# Patient Record
Sex: Female | Born: 1980 | Race: Black or African American | Hispanic: No | Marital: Single | Smoking: Never smoker
Health system: Southern US, Community
[De-identification: ages and names within clinical notes are randomized; demographics above are authoritative.]

## PROBLEM LIST (undated history)

## (undated) ENCOUNTER — Inpatient Hospital Stay: Payer: Self-pay

## (undated) DIAGNOSIS — B20 Human immunodeficiency virus [HIV] disease: Secondary | ICD-10-CM

## (undated) DIAGNOSIS — Z21 Asymptomatic human immunodeficiency virus [HIV] infection status: Secondary | ICD-10-CM

## (undated) DIAGNOSIS — D649 Anemia, unspecified: Secondary | ICD-10-CM

## (undated) DIAGNOSIS — E079 Disorder of thyroid, unspecified: Secondary | ICD-10-CM

## (undated) DIAGNOSIS — J45909 Unspecified asthma, uncomplicated: Secondary | ICD-10-CM

---

## 2016-04-11 ENCOUNTER — Encounter: Payer: Self-pay | Admitting: *Deleted

## 2016-04-11 ENCOUNTER — Emergency Department
Admission: EM | Admit: 2016-04-11 | Discharge: 2016-04-11 | Disposition: A | Discharge status: Home / Self Care | Payer: Medicaid Other | Attending: Emergency Medicine | Admitting: Emergency Medicine

## 2016-04-11 DIAGNOSIS — R200 Anesthesia of skin: Secondary | ICD-10-CM | POA: Diagnosis present

## 2016-04-11 DIAGNOSIS — G510 Bell's palsy: Secondary | ICD-10-CM | POA: Diagnosis not present

## 2016-04-11 HISTORY — DX: Disorder of thyroid, unspecified: E07.9

## 2016-04-11 MED ORDER — VALACYCLOVIR HCL 1 G PO TABS: 2000.0000 mg | ORAL_TABLET | Freq: Three times a day (TID) | ORAL | Status: AC

## 2016-04-11 MED ORDER — PREDNISONE 20 MG PO TABS: 60.0000 mg | ORAL_TABLET | Freq: Every day | ORAL | Status: DC

## 2016-04-11 NOTE — ED Provider Notes (Signed)
[facility] Emergency Department Provider Note        Time seen: ----------------------------------------- 10:00 AM on 04/11/2016 -----------------------------------------    I have reviewed the triage vital signs and the nursing notes.   HISTORY  Chief Complaint Numbness    HPI Taylor Oneal is a 35 y.o. female who presents to ER for complaints of left-sided facial weakness for the last week. She denies any other symptoms. Patient states it's time for her to close her left eye,she has never had these problems before. She is [redacted] weeks pregnant, denies vaginal bleeding or leakage of fluid. Baby is moving normally   Past Medical History  Diagnosis Date  . Thyroid disease     There are no active problems to display for this patient.   History reviewed. No pertinent past surgical history.  Allergies Review of patient's allergies indicates no known allergies.  Social History Social History  Substance Use Topics  . Smoking status: Never Smoker   . Smokeless tobacco: None  . Alcohol Use: No    Review of Systems Constitutional: Negative for fever. Eyes: Negative for visual changes. ENT: Negative for sore throat. Cardiovascular: Negative for chest pain. Respiratory: Negative for shortness of breath. Gastrointestinal: Negative for abdominal pain, vomiting and diarrhea. Skin: Negative for rash. Neurological: Negative for headaches, Positive for left-sided facial weakness  10-point ROS otherwise negative.  ____________________________________________   PHYSICAL EXAM:  VITAL SIGNS: ED Triage Vitals  Enc Vitals Group     BP 04/11/16 0954 118/78 mmHg     Pulse --      Resp 04/11/16 0954 17     Temp 04/11/16 0954 98.6 F (37 C)     Temp src --      SpO2 --      Weight --      Height --      Head Cir --      Peak Flow --      Pain Score --      Pain Loc --      Pain Edu? --      Excl. in [place]? --     Constitutional: Alert and  oriented. Well appearing and in no distress. Eyes: Conjunctivae are normal. PERRL. Normal extraocular movements.No conjunctival injection ENT   Head: Normocephalic and atraumatic.   Nose: No congestion/rhinnorhea.   Mouth/Throat: Mucous membranes are moist.   Neck: No stridor. Cardiovascular: Normal rate, regular rhythm. No murmurs, rubs, or gallops. Respiratory: Normal respiratory effort without tachypnea nor retractions. Breath sounds are clear and equal bilaterally. No wheezes/rales/rhonchi. Gastrointestinal: Soft and nontender. Normal bowel sounds. Gravid uterus, nontender Musculoskeletal: Nontender with normal range of motion in all extremities. No lower extremity tenderness nor edema. Neurologic:  Normal speech and language. 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 No other focal neurologic deficits are appreciated Skin:  Skin is warm, dry and intact. No rash noted. Psychiatric: Mood and affect are normal. Speech and behavior are normal.  ____________________________________________  ED COURSE:  Pertinent labs & imaging results that were available during my care of the patient were reviewed by me and considered in my medical decision making (see chart for details). Patient presents to ER with clinical Bell's palsy. We'll begin treatment for same.  ____________________________________________  FINAL ASSESSMENT AND PLAN  Bell's palsy  Plan: Patient with clinical Bell's palsy in pregnancy. She'll be started on prednisone and valacyclovir. She is encouraged to have close follow-up with her doctor for reevaluation.   Earleen Newport, MD  Note: This dictation was prepared with Dragon dictation. Any transcriptional errors that result from this process are unintentional   Earleen Newport, MD 04/11/16 1018

## 2016-04-11 NOTE — ED Notes (Signed)
Fetal Heart Tone assess at rate of 108

## 2016-04-11 NOTE — ED Notes (Signed)
Pt is [redacted] weeks pregnant, pt complains of left sided facial numbness for 1 week, pt denies any other symptoms

## 2016-04-11 NOTE — Discharge Instructions (Signed)
Bell Palsy °Bell palsy is a condition in which the muscles on one side of the face become paralyzed. This often causes one side of the face to droop. It is a common condition and most people recover completely. °RISK FACTORS °Risk factors for Bell palsy include: °· Pregnancy. °· Diabetes. °· An infection by a virus, such as infections that cause cold sores. °CAUSES  °Bell palsy is caused by damage to or inflammation of a nerve in your face. It is unclear why this happens, but an infection by a virus may lead to it. Most of the time the reason it happens is unknown. °SIGNS AND SYMPTOMS  °Symptoms can range from mild to severe and can take place over a number of hours. Symptoms may include: °· Being unable to: °¨ Raise one or both eyebrows. °¨ Close one or both eyes. °¨ Feel parts of your face (facial numbness). °· Drooping of the eyelid and corner of the mouth. °· Weakness in the face. °· Paralysis of half your face. °· Loss of taste. °· Sensitivity to loud noises. °· Difficulty chewing. °· Tearing up of the affected eye. °· Dryness in the affected eye. °· Drooling. °· Pain behind one ear. °DIAGNOSIS  °Diagnosis of Bell palsy may include: °· A medical history and physical exam. °· An MRI. °· A CT scan. °· Electromyography (EMG). This is a test that checks how your nerves are working. °TREATMENT  °Treatment may include antiviral medicine to help shorten the length of the condition. Sometimes treatment is not needed and the symptoms go away on their own. °HOME CARE INSTRUCTIONS  °· Take medicines only as directed by your health care provider. °· Do facial massages and exercises as directed by your health care provider. °· If your eye is affected: °¨ Use moisturizing eye drops to prevent drying of your eye as directed by your health care provider. °¨ Protect your eye as directed by your health care provider. °SEEK MEDICAL CARE IF: °· Your symptoms do not get better or get worse. °· You are drooling. °· Your eye is red,  irritated, or hurts. °SEEK IMMEDIATE MEDICAL CARE IF:  °· Another part of your body feels weak or numb. °· You have difficulty swallowing. °· You have a fever along with symptoms of Bell palsy. °· You develop neck pain. °MAKE SURE YOU:  °· Understand these instructions. °· Will watch your condition. °· Will get help right away if you are not doing well or get worse. °  °This information is not intended to replace advice given to you by your health care provider. Make sure you discuss any questions you have with your health care provider. °  °Document Released: 09/17/2005 Document Revised: 06/08/2015 Document Reviewed: 12/25/2013 °Elsevier Interactive Patient Education ©2016 [employer] ° °

## 2016-04-11 NOTE — ED Notes (Signed)
Pt verbalized understanding of discharge instructions. NAD at this time. 

## 2016-06-13 ENCOUNTER — Other Ambulatory Visit: Payer: Self-pay | Admitting: Obstetrics and Gynecology

## 2016-06-13 DIAGNOSIS — D259 Leiomyoma of uterus, unspecified: Secondary | ICD-10-CM

## 2016-06-13 DIAGNOSIS — O3413 Maternal care for benign tumor of corpus uteri, third trimester: Principal | ICD-10-CM

## 2016-06-15 ENCOUNTER — Ambulatory Visit
Admission: RE | Admit: 2016-06-15 | Discharge: 2016-06-15 | Disposition: A | Discharge status: Home / Self Care | Payer: Medicaid Other | Source: Ambulatory Visit | Attending: Obstetrics and Gynecology | Admitting: Obstetrics and Gynecology

## 2016-06-15 DIAGNOSIS — Z3A39 39 weeks gestation of pregnancy: Secondary | ICD-10-CM | POA: Diagnosis not present

## 2016-06-15 DIAGNOSIS — D259 Leiomyoma of uterus, unspecified: Secondary | ICD-10-CM

## 2016-06-15 DIAGNOSIS — O3413 Maternal care for benign tumor of corpus uteri, third trimester: Secondary | ICD-10-CM | POA: Insufficient documentation

## 2016-06-20 ENCOUNTER — Inpatient Hospital Stay
Admission: EM | Admit: 2016-06-20 | Discharge: 2016-06-20 | Disposition: A | Discharge status: Home / Self Care | Payer: Medicaid Other | Attending: Obstetrics and Gynecology | Admitting: Obstetrics and Gynecology

## 2016-06-20 DIAGNOSIS — Z3A40 40 weeks gestation of pregnancy: Secondary | ICD-10-CM | POA: Diagnosis not present

## 2016-06-20 HISTORY — DX: Asymptomatic human immunodeficiency virus (hiv) infection status: Z21

## 2016-06-20 HISTORY — DX: Unspecified asthma, uncomplicated: J45.909

## 2016-06-20 HISTORY — DX: Human immunodeficiency virus (HIV) disease: B20

## 2016-06-20 MED ORDER — OXYCODONE-ACETAMINOPHEN 5-325 MG PO TABS
1.0000 | ORAL_TABLET | Freq: Once | ORAL | Status: AC
  Administered 2016-06-20: 1 via ORAL
  Filled 2016-06-20: qty 1

## 2016-06-20 NOTE — Progress Notes (Signed)
Patient ID: Taylor Oneal, female   DOB: [birth year], 35 y.o.   MRN: [id number] [name] 05/17/1981 G1 P0 [redacted]w[redacted]d presents for ctx  Possible LOF , + vaginal bleeding , O;Temp 98.8 F (37.1 C) (Oral)   Resp 16  ABDsoft  CX 1/80/-1 vtx NSTreactive , CTX q 3-5 min Labs: neg fern  A: no rom , Ctx no active labor , P:d/c home  Po percocet  Precautions given

## 2016-06-20 NOTE — OB Triage Note (Signed)
Patient came in today c/o contractions that began yesterday afternoon. Ctrx are more intense and closer together per patient. Patient c/o vaginal bleeding that began today at 4am. Pt reports filling a pad with blood and passed clots. Reports positive fetal movement.

## 2016-06-20 NOTE — Discharge Summary (Signed)
  Patient ID: Taylor Oneal, female   DOB: 1980/10/06, 35 y.o.   MRN: [id number] [name] 06/21/1981 G1 P0 [redacted]w[redacted]d presents for ctx  Possible LOF , + vaginal bleeding , O;Temp 98.8 F (37.1 C) (Oral)   Resp 16  ABDsoft  CX 1/80/-1 vtx NSTreactive , CTX q 3-5 min Labs: neg fern  A: no rom , Ctx no active labor , P:d/c home  Po percocet  Precautions given     Electronically signed by Boykin Nearing, MD at 06/20/2016 8:42 AM

## 2016-06-22 ENCOUNTER — Encounter: Payer: Self-pay | Admitting: *Deleted

## 2016-06-22 ENCOUNTER — Inpatient Hospital Stay
Admission: EM | Admit: 2016-06-22 | Discharge: 2016-06-25 | DRG: 765 | Disposition: A | Discharge status: Home / Self Care | Payer: Medicaid Other | Attending: Obstetrics and Gynecology | Admitting: Obstetrics and Gynecology

## 2016-06-22 DIAGNOSIS — O98719 Human immunodeficiency virus [HIV] disease complicating pregnancy, unspecified trimester: Secondary | ICD-10-CM

## 2016-06-22 DIAGNOSIS — O9872 Human immunodeficiency virus [HIV] disease complicating childbirth: Secondary | ICD-10-CM | POA: Diagnosis present

## 2016-06-22 DIAGNOSIS — D62 Acute posthemorrhagic anemia: Secondary | ICD-10-CM | POA: Diagnosis not present

## 2016-06-22 DIAGNOSIS — Z79899 Other long term (current) drug therapy: Secondary | ICD-10-CM | POA: Diagnosis not present

## 2016-06-22 DIAGNOSIS — O9902 Anemia complicating childbirth: Secondary | ICD-10-CM | POA: Diagnosis present

## 2016-06-22 DIAGNOSIS — Z21 Asymptomatic human immunodeficiency virus [HIV] infection status: Secondary | ICD-10-CM | POA: Diagnosis present

## 2016-06-22 DIAGNOSIS — Z3A40 40 weeks gestation of pregnancy: Secondary | ICD-10-CM

## 2016-06-22 DIAGNOSIS — D259 Leiomyoma of uterus, unspecified: Secondary | ICD-10-CM | POA: Diagnosis present

## 2016-06-22 DIAGNOSIS — O9952 Diseases of the respiratory system complicating childbirth: Secondary | ICD-10-CM | POA: Diagnosis present

## 2016-06-22 DIAGNOSIS — O99820 Streptococcus B carrier state complicating pregnancy: Secondary | ICD-10-CM

## 2016-06-22 DIAGNOSIS — E039 Hypothyroidism, unspecified: Secondary | ICD-10-CM | POA: Diagnosis present

## 2016-06-22 DIAGNOSIS — J45909 Unspecified asthma, uncomplicated: Secondary | ICD-10-CM | POA: Diagnosis present

## 2016-06-22 DIAGNOSIS — O0993 Supervision of high risk pregnancy, unspecified, third trimester: Secondary | ICD-10-CM

## 2016-06-22 DIAGNOSIS — O3413 Maternal care for benign tumor of corpus uteri, third trimester: Secondary | ICD-10-CM | POA: Diagnosis present

## 2016-06-22 DIAGNOSIS — O99519 Diseases of the respiratory system complicating pregnancy, unspecified trimester: Secondary | ICD-10-CM

## 2016-06-22 DIAGNOSIS — O99344 Other mental disorders complicating childbirth: Secondary | ICD-10-CM | POA: Diagnosis present

## 2016-06-22 DIAGNOSIS — O99824 Streptococcus B carrier state complicating childbirth: Secondary | ICD-10-CM | POA: Diagnosis present

## 2016-06-22 DIAGNOSIS — F329 Major depressive disorder, single episode, unspecified: Secondary | ICD-10-CM | POA: Diagnosis present

## 2016-06-22 DIAGNOSIS — O99019 Anemia complicating pregnancy, unspecified trimester: Secondary | ICD-10-CM

## 2016-06-22 DIAGNOSIS — O99284 Endocrine, nutritional and metabolic diseases complicating childbirth: Secondary | ICD-10-CM | POA: Diagnosis present

## 2016-06-22 LAB — TYPE AND SCREEN
ABO/RH(D): O POS
Antibody Screen: NEGATIVE

## 2016-06-22 LAB — CBC WITH DIFFERENTIAL/PLATELET
Basophils Absolute: 0 10*3/uL (ref 0–0.1)
Basophils Relative: 0 %
EOS ABS: 0.1 10*3/uL (ref 0–0.7)
EOS PCT: 4 %
HCT: 32.6 % — ABNORMAL LOW (ref 35.0–47.0)
Hemoglobin: 11.6 g/dL — ABNORMAL LOW (ref 12.0–16.0)
LYMPHS ABS: 0.7 10*3/uL — AB (ref 1.0–3.6)
Lymphocytes Relative: 18 %
MCH: 33.1 pg (ref 26.0–34.0)
MCHC: 35.6 g/dL (ref 32.0–36.0)
MCV: 92.9 fL (ref 80.0–100.0)
MONO ABS: 0.6 10*3/uL (ref 0.2–0.9)
MONOS PCT: 15 %
Neutro Abs: 2.6 10*3/uL (ref 1.4–6.5)
Neutrophils Relative %: 63 %
PLATELETS: 153 10*3/uL (ref 150–440)
RBC: 3.51 MIL/uL — ABNORMAL LOW (ref 3.80–5.20)
RDW: 13 % (ref 11.5–14.5)
WBC: 4.1 10*3/uL (ref 3.6–11.0)

## 2016-06-22 MED ORDER — LIDOCAINE HCL (PF) 1 % IJ SOLN
INTRAMUSCULAR | Status: AC
  Filled 2016-06-22: qty 30

## 2016-06-22 MED ORDER — PENICILLIN G POTASSIUM 5000000 UNITS IJ SOLR
5.0000 10*6.[IU] | Freq: Once | INTRAVENOUS | Status: AC
  Administered 2016-06-22: 5 10*6.[IU] via INTRAVENOUS
  Filled 2016-06-22 (×2): qty 5

## 2016-06-22 MED ORDER — TENOFOVIR DISOPROXIL FUMARATE 300 MG PO TABS: 300.0000 mg | ORAL_TABLET | Freq: Every day | ORAL | Status: DC

## 2016-06-22 MED ORDER — PENICILLIN G POTASSIUM 5000000 UNITS IJ SOLR
2.5000 10*6.[IU] | INTRAVENOUS | Status: DC
  Administered 2016-06-22 – 2016-06-23 (×2): 2.5 10*6.[IU] via INTRAVENOUS
  Filled 2016-06-22 (×9): qty 2.5

## 2016-06-22 MED ORDER — LIDOCAINE HCL (PF) 1 % IJ SOLN: 30.0000 mL | INTRAMUSCULAR | Status: DC | PRN

## 2016-06-22 MED ORDER — AMPICILLIN SODIUM 1 G IJ SOLR
INTRAMUSCULAR | Status: AC
  Administered 2016-06-22
  Filled 2016-06-22: qty 1000

## 2016-06-22 MED ORDER — AMMONIA AROMATIC IN INHA
RESPIRATORY_TRACT | Status: AC
  Filled 2016-06-22: qty 10

## 2016-06-22 MED ORDER — MISOPROSTOL 200 MCG PO TABS
ORAL_TABLET | ORAL | Status: AC
  Filled 2016-06-22: qty 4

## 2016-06-22 MED ORDER — SODIUM CHLORIDE FLUSH 0.9 % IV SOLN
INTRAVENOUS | Status: AC
  Filled 2016-06-22: qty 20

## 2016-06-22 MED ORDER — ACETAMINOPHEN 325 MG PO TABS: 650.0000 mg | ORAL_TABLET | ORAL | Status: DC | PRN

## 2016-06-22 MED ORDER — OXYTOCIN 10 UNIT/ML IJ SOLN
INTRAMUSCULAR | Status: AC
  Filled 2016-06-22: qty 2

## 2016-06-22 MED ORDER — FENTANYL CITRATE (PF) 100 MCG/2ML IJ SOLN
50.0000 ug | INTRAMUSCULAR | Status: DC | PRN
  Administered 2016-06-22 (×2): 100 ug via INTRAVENOUS
  Filled 2016-06-22 (×2): qty 2

## 2016-06-22 MED ORDER — BUTORPHANOL TARTRATE 1 MG/ML IJ SOLN
1.0000 mg | INTRAMUSCULAR | Status: DC | PRN
  Administered 2016-06-22: 1 mg via INTRAVENOUS
  Filled 2016-06-22: qty 1

## 2016-06-22 MED ORDER — OXYTOCIN 40 UNITS IN LACTATED RINGERS INFUSION - SIMPLE MED: 2.5000 [IU]/h | INTRAVENOUS | Status: DC

## 2016-06-22 MED ORDER — SOD CITRATE-CITRIC ACID 500-334 MG/5ML PO SOLN
30.0000 mL | ORAL | Status: DC | PRN
  Administered 2016-06-23: 30 mL via ORAL

## 2016-06-22 MED ORDER — OXYTOCIN BOLUS FROM INFUSION: 500.0000 mL | Freq: Once | INTRAVENOUS | Status: DC

## 2016-06-22 MED ORDER — LACTATED RINGERS IV SOLN: 500.0000 mL | INTRAVENOUS | Status: DC | PRN

## 2016-06-22 MED ORDER — ONDANSETRON HCL 4 MG/2ML IJ SOLN
4.0000 mg | Freq: Four times a day (QID) | INTRAMUSCULAR | Status: DC | PRN
  Administered 2016-06-23: 4 mg via INTRAVENOUS

## 2016-06-22 MED ORDER — ZIDOVUDINE 10 MG/ML IV SOLN
2.0000 mg/kg | Freq: Once | INTRAVENOUS | Status: AC
  Administered 2016-06-23: 176 mg via INTRAVENOUS
  Filled 2016-06-22: qty 17.6

## 2016-06-22 MED ORDER — ZIDOVUDINE NICU ORAL SYRINGE 10 MG/ML: 4.0000 mg/kg | ORAL_SOLUTION | Freq: Two times a day (BID) | ORAL | Status: DC

## 2016-06-22 MED ORDER — EMTRICITABINE-TENOFOVIR DF 200-300 MG PO TABS
1.0000 | ORAL_TABLET | Freq: Every day | ORAL | Status: DC
  Administered 2016-06-23 – 2016-06-25 (×3): 1 via ORAL
  Filled 2016-06-22 (×4): qty 1

## 2016-06-22 MED ORDER — LACTATED RINGERS IV SOLN
INTRAVENOUS | Status: DC
  Administered 2016-06-22 – 2016-06-23 (×2): via INTRAVENOUS

## 2016-06-22 MED ORDER — RALTEGRAVIR POTASSIUM 400 MG PO TABS
400.0000 mg | ORAL_TABLET | Freq: Two times a day (BID) | ORAL | Status: DC
  Administered 2016-06-22 – 2016-06-25 (×6): 400 mg via ORAL
  Filled 2016-06-22 (×8): qty 1

## 2016-06-22 MED ORDER — EMTRICITABINE 200 MG PO CAPS: 200.0000 mg | ORAL_CAPSULE | Freq: Every day | ORAL | Status: DC

## 2016-06-22 MED ORDER — FLEET ENEMA 7-19 GM/118ML RE ENEM: 1.0000 | ENEMA | RECTAL | Status: DC | PRN

## 2016-06-22 MED ORDER — ZIDOVUDINE 10 MG/ML IV SOLN
1.0000 mg/kg/h | INTRAVENOUS | Status: DC
  Administered 2016-06-23: 1 mg/kg/h via INTRAVENOUS
  Filled 2016-06-22: qty 40

## 2016-06-22 MED ORDER — TERBUTALINE SULFATE 1 MG/ML IJ SOLN: 0.2500 mg | Freq: Once | INTRAMUSCULAR | Status: DC | PRN

## 2016-06-22 MED ORDER — OXYTOCIN 40 UNITS IN LACTATED RINGERS INFUSION - SIMPLE MED
1.0000 m[IU]/min | INTRAVENOUS | Status: DC
  Administered 2016-06-22: 1 m[IU]/min via INTRAVENOUS

## 2016-06-22 NOTE — OB Triage Note (Signed)
Presents with complaint of SROM at 1210 this afternoon. States she soaked thru pad and pants. States fluid was clear. Having contractions as well.

## 2016-06-22 NOTE — Progress Notes (Signed)
S:  Breathing well through ctxs      Still having variable decelerations with every other contraction to nadir of 60 bpm with good return to baseline, moderate variability and +accels       Has had second dose of Fentanyl   O:  VS: Blood pressure 113/63, pulse 71, temperature 97.9 F (36.6 C), temperature source Oral, resp. rate 18, height 5\' 4"  (1.626 m), weight 88 kg (194 lb).        FHR : baseline 120 bpm / variability moderate / accelerations + / variable decelerations        Toco: contractions every 4-6 minutes / mild        Cervix : Dilation: 1 Effacement (%): 90 Cervical Position: Anterior Station: -1 Presentation: Vertex Exam by:: Clementeen Graham, RN        Membranes: SROM - clear fluid  A: Latent labor     FHR category 2     HIV positive  P: Discussed with Dr. Leonides Schanz - okay to try to start Pitocin        AZT loading dose 176mg  ordered and instructed to give first, then AZT continuous dose      Will continue to monitor closely   Lars Pinks, CNM

## 2016-06-22 NOTE — Progress Notes (Signed)
S:  Pt. States she did not like the way the Stadol feels, and is still having painful ctxs.  She is requesting something else for pain.   O:  VS: Blood pressure 113/63, pulse 71, temperature 97.9 F (36.6 C), temperature source Oral, resp. rate 18, height 5\' 4"  (1.626 m), weight 88 kg (194 lb).        FHR : baseline 115 bpm / variability moderate / accelerations + / variable decelerations to a nadir of 60bpm with every other ctx        Toco: contractions every 3-8 minutes / moderate         Cervix : Dilation: 1 Effacement (%): 80 Cervical Position: Anterior Station: -2, -1 Presentation: Vertex Exam by:: JMG        Membranes: SROM - clear fluid  A: Latent labor     FHR category 2  P: Dr. Leonides Schanz notified and aware of variables - we will try to avoid using IUPC if we can     HIV RNA pending     Fentanyl 50-156mcg every 1 hour PRN      Will begin Pitocin if she is unable to make cervical change     Reassess in 1-2 hours  Taylor Oneal,CNM

## 2016-06-22 NOTE — H&P (Signed)
OB ADMISSION/ HISTORY & PHYSICAL:  Admission Date: 06/22/2016  1:17 PM  Admit Diagnosis: SROM at 40+4 weeks   Taylor Oneal is a 35 y.o. female G1P0 at 40+4 weeks presenting for SROM at 1210 and reports clear fluid.  Prenatal History: G1P0   EDC : 06/18/2016,  [facility]atal care at [facility] and [facility] - late transfer to [facility] for delivery for privacy purposes Prenatal course complicated by HIV Positive on Isentress and Truvada with last viral load undetectable, GBS positive, multiple uterine fibroids, anemia, depression, asthma   Prenatal Labs: ABO, Rh: --/--/O POS (09/22 1400) Antibody: NEG (09/22 1400) Rubella:   Immune Varicella: Immune RPR:   NR HBsAg:   Negative  HIV:   Positive  GTT: 110 GBS:   Positive   Medical / Surgical History :  Past medical history:  Past Medical History:  Diagnosis Date  . Asthma    long time ago per patient   . HIV (human immunodeficiency virus infection) ([place])   . Thyroid disease      Past surgical history: History reviewed. No pertinent surgical history.  Family History:  Family History  Problem Relation Age of Onset  . Hypertension Mother   . Diabetes Father   . Hypertension Father   . Hypertension Maternal Grandfather      Social History:  reports that she has never smoked. She has never used smokeless tobacco. She reports that she does not drink alcohol or use drugs.   Allergies: Review of patient's allergies indicates no known allergies.    Current Medications at time of admission:  Prior to Admission medications   Medication Sig Start Date End Date Taking? Authorizing Provider  raltegravir (ISENTRESS) 400 MG tablet Take 400 mg by mouth 2 (two) times daily.   Yes Historical Provider, MD  predniSONE (DELTASONE) 20 MG tablet Take 3 tablets (60 mg total) by mouth daily with breakfast. Patient not taking: Reported on 06/20/2016 04/11/16   Earleen Newport, MD  Prenatal Vit-Fe Fumarate-FA (PRENATAL MULTIVITAMIN) TABS tablet  Take 1 tablet by mouth daily at 12 noon.    Historical Provider, MD     Review of Systems: Active FM Irregular ctxs SROM at 1210 - clear fluid bloody show present   Physical Exam:  VS: Blood pressure 105/78, pulse 80, temperature 97.9 F (36.6 C), temperature source Oral, resp. rate 18, height 5\' 4"  (1.626 m), weight 88 kg (194 lb).  General: alert and oriented, appears calm Heart: RRR Lungs: Clear lung fields Abdomen: Gravid, soft and non-tender, non-distended / uterus: non-tender Extremities: no edema  Genitalia / VE: Dilation: 1 Effacement (%): 80 Station: -2, -1 Exam by:: JMG  FHR: baseline rate 125 bpm / variability moderate / accelerations + / no decelerations TOCO: every 4-8minutes  Assessment: 40+[redacted] weeks gestation Latent stage of labor FHR category 2 HIV Positive    Plan:  1. Admit to Birth Place    - Routine labor and delivery orders    - Stadol 1mg  every 1 hour PRN for pain     - May have epidural upon request  2. GBS Positive    - PCN 5 million units loading dose, then 2.5 million units every 4 hours 3. HIV Positive    - Continue Truvada and Isentress daily     - HIV RNA viral load ordered    - Discussed starting IV AZT, and at this time is not necessary since her last viral load was not detected, but we will wait to see this  viral load      - Avoid internal monitors    PLAN FOR BABY: we usually give IV zidovudine intrapartum (although is not absolutely necessary if viral load is suppressed but we still do it at [facility] for consistency). 2. the baby will need to be treated with PO Zidovudine 4mg /kg/dose every 12 hours, given as soon as possible after birth (within 6 hours please!). can give IV if baby is NPO. 3. If there are delivery complications, such as maternal chorioamnionitis, prolonged ROM, please let us know as we may need to treat the baby with additional anti-retroviral medications. 4. The baby's bath should be expedited after delivery; please  defer vitamin K injection and blood draws until AFTER bath has been performed.  5. Send a HIV test at birth for the baby--we use [facility] for this - (test name: Human Immunodeficiency Virus 1 (HIV-1), Qualitative, RNA, performed at [place], TEST# [id number], need 40ml of blood in EDTA tube), Fax results to [place] [phone]  4. Anticipate NSVD  Dr. Leonides Schanz notified of admission / plan of care  Taylor Oneal, CNM

## 2016-06-23 ENCOUNTER — Inpatient Hospital Stay: Payer: Medicaid Other | Admitting: Anesthesiology

## 2016-06-23 ENCOUNTER — Encounter: Payer: Self-pay | Admitting: Anesthesiology

## 2016-06-23 ENCOUNTER — Encounter
Admission: EM | Disposition: A | Discharge status: Home / Self Care | Payer: Self-pay | Source: Home / Self Care | Attending: Obstetrics and Gynecology

## 2016-06-23 DIAGNOSIS — O98719 Human immunodeficiency virus [HIV] disease complicating pregnancy, unspecified trimester: Secondary | ICD-10-CM

## 2016-06-23 DIAGNOSIS — O99019 Anemia complicating pregnancy, unspecified trimester: Secondary | ICD-10-CM

## 2016-06-23 DIAGNOSIS — O0993 Supervision of high risk pregnancy, unspecified, third trimester: Secondary | ICD-10-CM

## 2016-06-23 DIAGNOSIS — O99519 Diseases of the respiratory system complicating pregnancy, unspecified trimester: Secondary | ICD-10-CM

## 2016-06-23 DIAGNOSIS — D259 Leiomyoma of uterus, unspecified: Secondary | ICD-10-CM

## 2016-06-23 DIAGNOSIS — J45909 Unspecified asthma, uncomplicated: Secondary | ICD-10-CM

## 2016-06-23 DIAGNOSIS — O99820 Streptococcus B carrier state complicating pregnancy: Secondary | ICD-10-CM

## 2016-06-23 LAB — RPR: RPR: NONREACTIVE

## 2016-06-23 SURGERY — Surgical Case
Anesthesia: Epidural

## 2016-06-23 MED ORDER — SOD CITRATE-CITRIC ACID 500-334 MG/5ML PO SOLN
ORAL | Status: AC
  Administered 2016-06-23: 30 mL via ORAL
  Filled 2016-06-23: qty 15

## 2016-06-23 MED ORDER — DIBUCAINE 1 % RE OINT
1.0000 | TOPICAL_OINTMENT | RECTAL | Status: DC | PRN
Start: 2016-06-23 — End: 2016-06-25

## 2016-06-23 MED ORDER — COCONUT OIL OIL: 1.0000 "application " | TOPICAL_OIL | Status: DC | PRN

## 2016-06-23 MED ORDER — CEFAZOLIN SODIUM-DEXTROSE 2-4 GM/100ML-% IV SOLN
INTRAVENOUS | Status: AC
  Administered 2016-06-23: 2 g via INTRAVENOUS
  Filled 2016-06-23: qty 100

## 2016-06-23 MED ORDER — DEXTROSE 5 % IV SOLN
500.0000 mg | Freq: Once | INTRAVENOUS | Status: AC
  Administered 2016-06-23: 500 mg via INTRAVENOUS
  Filled 2016-06-23: qty 500

## 2016-06-23 MED ORDER — DIPHENHYDRAMINE HCL 50 MG/ML IJ SOLN: 12.5000 mg | INTRAMUSCULAR | Status: DC | PRN

## 2016-06-23 MED ORDER — SODIUM CHLORIDE 0.9% FLUSH: 3.0000 mL | INTRAVENOUS | Status: DC | PRN

## 2016-06-23 MED ORDER — BUPIVACAINE LIPOSOME 1.3 % IJ SUSP
INTRAMUSCULAR | Status: DC | PRN
  Administered 2016-06-23: 50 mL

## 2016-06-23 MED ORDER — EPHEDRINE SULFATE 50 MG/ML IJ SOLN
INTRAMUSCULAR | Status: DC | PRN
Start: 2016-06-23 — End: 2016-06-23
  Administered 2016-06-23: 20 mg via INTRAVENOUS

## 2016-06-23 MED ORDER — OXYCODONE HCL 5 MG PO TABS
5.0000 mg | ORAL_TABLET | ORAL | Status: DC | PRN
  Administered 2016-06-24: 5 mg via ORAL

## 2016-06-23 MED ORDER — DEXAMETHASONE SODIUM PHOSPHATE 10 MG/ML IJ SOLN
INTRAMUSCULAR | Status: DC | PRN
  Administered 2016-06-23: 10 mg via INTRAVENOUS

## 2016-06-23 MED ORDER — PHENYLEPHRINE 40 MCG/ML (10ML) SYRINGE FOR IV PUSH (FOR BLOOD PRESSURE SUPPORT): 80.0000 ug | PREFILLED_SYRINGE | INTRAVENOUS | Status: DC | PRN

## 2016-06-23 MED ORDER — MEPERIDINE HCL 25 MG/ML IJ SOLN: 6.2500 mg | INTRAMUSCULAR | Status: DC | PRN

## 2016-06-23 MED ORDER — ACETAMINOPHEN 500 MG PO TABS
1000.0000 mg | ORAL_TABLET | Freq: Four times a day (QID) | ORAL | Status: DC
  Administered 2016-06-23 – 2016-06-25 (×6): 1000 mg via ORAL
  Filled 2016-06-23 (×7): qty 2

## 2016-06-23 MED ORDER — LACTATED RINGERS IV SOLN
INTRAVENOUS | Status: DC
Start: 2016-06-23 — End: 2016-06-23

## 2016-06-23 MED ORDER — WITCH HAZEL-GLYCERIN EX PADS: 1.0000 "application " | MEDICATED_PAD | CUTANEOUS | Status: DC | PRN

## 2016-06-23 MED ORDER — PRENATAL MULTIVITAMIN CH
1.0000 | ORAL_TABLET | Freq: Every day | ORAL | Status: DC
Start: 2016-06-23 — End: 2016-06-25
  Administered 2016-06-23 – 2016-06-24 (×2): 1 via ORAL
  Filled 2016-06-23 (×2): qty 1

## 2016-06-23 MED ORDER — LIDOCAINE HCL (PF) 2 % IJ SOLN
INTRAMUSCULAR | Status: DC | PRN
  Administered 2016-06-23: 4 mL via INTRADERMAL

## 2016-06-23 MED ORDER — OXYCODONE HCL 5 MG PO TABS
10.0000 mg | ORAL_TABLET | ORAL | Status: DC | PRN
  Administered 2016-06-24: 10 mg via ORAL
  Filled 2016-06-23 (×2): qty 2

## 2016-06-23 MED ORDER — PHENYLEPHRINE 40 MCG/ML (10ML) SYRINGE FOR IV PUSH (FOR BLOOD PRESSURE SUPPORT): PREFILLED_SYRINGE | INTRAVENOUS | Status: DC | PRN

## 2016-06-23 MED ORDER — BUPIVACAINE HCL (PF) 0.5 % IJ SOLN
INTRAMUSCULAR | Status: DC | PRN
  Administered 2016-06-23: 3 mL

## 2016-06-23 MED ORDER — INFLUENZA VAC SPLIT QUAD 0.5 ML IM SUSY: 0.5000 mL | PREFILLED_SYRINGE | INTRAMUSCULAR | Status: DC | PRN

## 2016-06-23 MED ORDER — MORPHINE SULFATE (PF) 2 MG/ML IV SOLN: 1.0000 mg | INTRAVENOUS | Status: AC | PRN

## 2016-06-23 MED ORDER — NALBUPHINE HCL 10 MG/ML IJ SOLN: 5.0000 mg | Freq: Once | INTRAMUSCULAR | Status: DC | PRN

## 2016-06-23 MED ORDER — LACTATED RINGERS IV SOLN: 500.0000 mL | Freq: Once | INTRAVENOUS | Status: DC

## 2016-06-23 MED ORDER — SODIUM CHLORIDE 0.9 % IV SOLN
INTRAVENOUS | Status: DC | PRN
  Administered 2016-06-23: 50 mL via INTRAMUSCULAR

## 2016-06-23 MED ORDER — LIDOCAINE-EPINEPHRINE (PF) 1.5 %-1:200000 IJ SOLN
INTRAMUSCULAR | Status: DC | PRN
  Administered 2016-06-23: 3 mL via PERINEURAL

## 2016-06-23 MED ORDER — EPHEDRINE 5 MG/ML INJ
10.0000 mg | INTRAVENOUS | Status: DC | PRN
  Administered 2016-06-23: 10 mg via INTRAVENOUS

## 2016-06-23 MED ORDER — NALOXONE HCL 0.4 MG/ML IJ SOLN: 0.4000 mg | INTRAMUSCULAR | Status: DC | PRN

## 2016-06-23 MED ORDER — DIPHENHYDRAMINE HCL 25 MG PO CAPS: 25.0000 mg | ORAL_CAPSULE | Freq: Four times a day (QID) | ORAL | Status: DC | PRN

## 2016-06-23 MED ORDER — OXYTOCIN 40 UNITS IN LACTATED RINGERS INFUSION - SIMPLE MED: 2.5000 [IU]/h | INTRAVENOUS | Status: AC

## 2016-06-23 MED ORDER — IBUPROFEN 600 MG PO TABS
600.0000 mg | ORAL_TABLET | Freq: Four times a day (QID) | ORAL | Status: DC
  Administered 2016-06-23 – 2016-06-25 (×7): 600 mg via ORAL
  Filled 2016-06-23 (×3): qty 1

## 2016-06-23 MED ORDER — PHENYLEPHRINE HCL 10 MG/ML IJ SOLN
INTRAMUSCULAR | Status: DC | PRN
  Administered 2016-06-23: 200 ug via INTRAVENOUS
  Administered 2016-06-23: 100 ug via INTRAVENOUS
  Administered 2016-06-23: 200 ug via INTRAVENOUS
  Administered 2016-06-23: 100 ug via INTRAVENOUS
  Administered 2016-06-23: 200 ug via INTRAVENOUS

## 2016-06-23 MED ORDER — FENTANYL 2.5 MCG/ML W/ROPIVACAINE 0.2% IN NS 100 ML EPIDURAL INFUSION (ARMC-ANES)
EPIDURAL | Status: AC
  Filled 2016-06-23: qty 100

## 2016-06-23 MED ORDER — NALBUPHINE HCL 10 MG/ML IJ SOLN: 5.0000 mg | INTRAMUSCULAR | Status: DC | PRN

## 2016-06-23 MED ORDER — LIDOCAINE HCL (PF) 1 % IJ SOLN
INTRAMUSCULAR | Status: DC | PRN
  Administered 2016-06-23: 3 mL

## 2016-06-23 MED ORDER — CEFAZOLIN SODIUM-DEXTROSE 2-4 GM/100ML-% IV SOLN
2.0000 g | Freq: Once | INTRAVENOUS | Status: AC
  Administered 2016-06-23: 2 g via INTRAVENOUS
  Filled 2016-06-23: qty 100

## 2016-06-23 MED ORDER — MORPHINE SULFATE (PF) 0.5 MG/ML IJ SOLN
INTRAMUSCULAR | Status: DC | PRN
  Administered 2016-06-23: 2 mg via EPIDURAL

## 2016-06-23 MED ORDER — ONDANSETRON HCL 4 MG/2ML IJ SOLN: 4.0000 mg | Freq: Three times a day (TID) | INTRAMUSCULAR | Status: DC | PRN

## 2016-06-23 MED ORDER — EPHEDRINE 5 MG/ML INJ: 10.0000 mg | INTRAVENOUS | Status: DC | PRN

## 2016-06-23 MED ORDER — LACTATED RINGERS IV SOLN: INTRAVENOUS | Status: DC

## 2016-06-23 MED ORDER — BUPIVACAINE HCL (PF) 0.25 % IJ SOLN
INTRAMUSCULAR | Status: DC | PRN
  Administered 2016-06-23: 10 mL via EPIDURAL

## 2016-06-23 MED ORDER — IBUPROFEN 600 MG PO TABS
600.0000 mg | ORAL_TABLET | Freq: Four times a day (QID) | ORAL | Status: DC | PRN
  Administered 2016-06-23: 600 mg via ORAL
  Filled 2016-06-23 (×6): qty 1

## 2016-06-23 MED ORDER — MENTHOL 3 MG MT LOZG
1.0000 | LOZENGE | OROMUCOSAL | Status: DC | PRN
  Filled 2016-06-23: qty 9

## 2016-06-23 MED ORDER — FENTANYL 2.5 MCG/ML W/ROPIVACAINE 0.2% IN NS 100 ML EPIDURAL INFUSION (ARMC-ANES)
10.0000 mL/h | EPIDURAL | Status: DC
  Administered 2016-06-23: 10 mL/h via EPIDURAL

## 2016-06-23 MED ORDER — ONDANSETRON HCL 4 MG/2ML IJ SOLN: 4.0000 mg | Freq: Once | INTRAMUSCULAR | Status: DC | PRN

## 2016-06-23 MED ORDER — SODIUM CHLORIDE 0.9% FLUSH: 3.0000 mL | Freq: Two times a day (BID) | INTRAVENOUS | Status: DC

## 2016-06-23 MED ORDER — NALOXONE HCL 2 MG/2ML IJ SOSY
1.0000 ug/kg/h | PREFILLED_SYRINGE | INTRAVENOUS | Status: DC | PRN
  Filled 2016-06-23: qty 2

## 2016-06-23 MED ORDER — FENTANYL CITRATE (PF) 100 MCG/2ML IJ SOLN: 25.0000 ug | INTRAMUSCULAR | Status: DC | PRN

## 2016-06-23 MED ORDER — OXYTOCIN 40 UNITS IN LACTATED RINGERS INFUSION - SIMPLE MED
INTRAVENOUS | Status: AC
  Filled 2016-06-23: qty 1000

## 2016-06-23 MED ORDER — OXYTOCIN 40 UNITS IN LACTATED RINGERS INFUSION - SIMPLE MED
INTRAVENOUS | Status: DC | PRN
  Administered 2016-06-23 (×2): 40 mL via INTRAVENOUS

## 2016-06-23 MED ORDER — DIPHENHYDRAMINE HCL 25 MG PO CAPS: 25.0000 mg | ORAL_CAPSULE | ORAL | Status: DC | PRN

## 2016-06-23 MED ORDER — SODIUM CHLORIDE 0.9 % IV SOLN
250.0000 mL | INTRAVENOUS | Status: DC
Start: 2016-06-23 — End: 2016-06-25

## 2016-06-23 MED ORDER — SIMETHICONE 80 MG PO CHEW
160.0000 mg | CHEWABLE_TABLET | Freq: Four times a day (QID) | ORAL | Status: DC | PRN
  Administered 2016-06-24: 160 mg via ORAL
  Filled 2016-06-23: qty 2

## 2016-06-23 MED ORDER — IBUPROFEN 600 MG PO TABS: 600.0000 mg | ORAL_TABLET | Freq: Four times a day (QID) | ORAL | Status: DC

## 2016-06-23 SURGICAL SUPPLY — 36 items
CANISTER SUCT 3000ML (MISCELLANEOUS) ×3 IMPLANT
CATH KIT ON-Q SILVERSOAK 5IN (CATHETERS) IMPLANT
CLOSURE WOUND 1/2 X4 (GAUZE/BANDAGES/DRESSINGS)
CNTNR SPEC 2.5X3XGRAD LEK (MISCELLANEOUS) ×1
CONT SPEC 4OZ STER OR WHT (MISCELLANEOUS) ×2
CONTAINER SPEC 2.5X3XGRAD LEK (MISCELLANEOUS) ×1 IMPLANT
DRSG TELFA 3X8 NADH (GAUZE/BANDAGES/DRESSINGS) IMPLANT
ELECT CAUTERY BLADE 6.4 (BLADE) ×3 IMPLANT
ELECT REM PT RETURN 9FT ADLT (ELECTROSURGICAL) ×3
ELECTRODE REM PT RTRN 9FT ADLT (ELECTROSURGICAL) ×1 IMPLANT
GAUZE SPONGE 4X4 12PLY STRL (GAUZE/BANDAGES/DRESSINGS) IMPLANT
GLOVE BIOGEL PI IND STRL 6.5 (GLOVE) ×3 IMPLANT
GLOVE BIOGEL PI INDICATOR 6.5 (GLOVE) ×6
GLOVE SURG SYN 6.5 ES PF (GLOVE) ×9 IMPLANT
GOWN STRL REUS W/ TWL LRG LVL3 (GOWN DISPOSABLE) ×3 IMPLANT
GOWN STRL REUS W/TWL LRG LVL3 (GOWN DISPOSABLE) ×6
LIQUID BAND (GAUZE/BANDAGES/DRESSINGS) ×3 IMPLANT
NEEDLE HYPO 22GX1.5 SAFETY (NEEDLE) ×3 IMPLANT
NS IRRIG 1000ML POUR BTL (IV SOLUTION) ×3 IMPLANT
PACK C SECTION AR (MISCELLANEOUS) ×3 IMPLANT
PAD OB MATERNITY 4.3X12.25 (PERSONAL CARE ITEMS) ×3 IMPLANT
PAD PREP 24X41 OB/GYN DISP (PERSONAL CARE ITEMS) ×3 IMPLANT
SPONGE LAP 18X18 5 PK (GAUZE/BANDAGES/DRESSINGS) ×3 IMPLANT
STRAP SAFETY BODY (MISCELLANEOUS) ×3 IMPLANT
STRIP CLOSURE SKIN 1/2X4 (GAUZE/BANDAGES/DRESSINGS) IMPLANT
SUT MNCRL 4-0 (SUTURE) ×2
SUT MNCRL 4-0 27XMFL (SUTURE) ×1
SUT PDS AB 1 TP1 96 (SUTURE) ×3 IMPLANT
SUT VIC AB 0 CT1 36 (SUTURE) ×6 IMPLANT
SUT VIC AB 2-0 CT1 27 (SUTURE) ×2
SUT VIC AB 2-0 CT1 TAPERPNT 27 (SUTURE) ×1 IMPLANT
SUT VIC AB 3-0 SH 27 (SUTURE) ×2
SUT VIC AB 3-0 SH 27X BRD (SUTURE) ×1 IMPLANT
SUTURE MNCRL 4-0 27XMF (SUTURE) ×1 IMPLANT
SWABSTK COMLB BENZOIN TINCTURE (MISCELLANEOUS) IMPLANT
SYR 20CC LL (SYRINGE) ×3 IMPLANT

## 2016-06-23 NOTE — Discharge Summary (Signed)
Obstetrical Discharge Summary  Patient Name: Taylor Oneal DOB: [birth year] MRN: [id number]  Date of Admission: 06/22/2016 Date of Discharge: 06/25/16 Primary OB: Taylor Oneal, Department of OBGYN  Gestational Age at Delivery: [redacted]w[redacted]d   Antepartum complications: HIV positive with undetectable viral load on 3 antiretrovirals, fibroid uterus, GBS positive, anemia, asthma, depression Admitting Diagnosis: rupture of membranes at term Secondary Diagnosis: Patient Active Problem List   Diagnosis Date Noted  . HIV disease affecting pregnancy 06/23/2016  . Anemia affecting pregnancy 06/23/2016  . Fibroid uterus 06/23/2016  . GBS (group B Streptococcus carrier), +RV culture, currently pregnant 06/23/2016  . Asthma affecting pregnancy, antepartum 06/23/2016  . Supervision of high risk pregnancy in third trimester 06/23/2016  . Labor and delivery, indication for care 06/22/2016    Augmentation: Pitocin Complications: None Intrapartum complications/course: 35 y.o. female at [redacted]w[redacted]d admitted for SROM at 40+[redacted] weeks EGA. Expectant management, then active management with pitocin, with persistent category 2 tracing, deep variables. Amnioinfusion was started without significant improvement; deep variables still occurring with contractions to nadir of 50s. Between variables, normal baseline and accelerations are present. It was explained to patient that overall the FHT was reassuring and did not show signs of fetal compromise, that further augmentation with pitocin would create more, stronger contractions, and these variables likely become more frequent; that this could either not change the status of the oxygen delivery or it could. Patient prefered to not take that risk and asked for a primary cesarean delivery.  Date of Delivery: 06/23/16 Delivered By: Taylor Oneal Delivery Type: primary cesarean section, low transverse incision with double layer closure Anesthesia: epidural Placenta:  sponatneous Laceration: n/a Episiotomy: none Newborn Data: Live born female  Birth Weight: 7 lb 11.5 oz (3500 g) APGAR: 8, 9    Discharge Physical Exam: lungs cta    BP 118/74   Pulse 91   Temp 97.6 F (36.4 C) (Oral)   Resp 18   Ht 5\' 4"  (1.626 m)   Wt 88 kg (194 lb)   SpO2 100%   Breastfeeding? Unknown   BMI 33.30 kg/m   General: NAD CV: RRR Pulm: CTABL, nl effort ABD: s/nd/nt, fundus firm and below the umbilicus Lochia: moderate Incision: Incision small amt of serosanguinous drainage , incision intact , no erythema   DVT Evaluation: LE non-ttp, no evidence of DVT on exam.  Hemoglobin  Date Value Ref Range Status  06/22/2016 11.6 (L) 12.0 - 16.0 g/dL Final   HCT  Date Value Ref Range Status  06/22/2016 32.6 (L) 35.0 - 47.0 % Final    Post partum course:underwent LTCS for fetal intol to labor  Postpartum Procedures: routine PP care  Disposition: stable, discharge to home. Baby Feeding: bottle feeding  Baby Disposition: home with mom  Rh Immune globulin given: no Rubella vaccine given: no Tdap vaccine given in AP or PP setting: AP Flu vaccine given in AP or PP setting: post partum  Contraception: TBD  Prenatal Labs:  Prenatal Labs: ABO, Rh: O POS (09/22 1400) Antibody: NEG (09/22 1400) Rubella:   Immune Varicella: Immune RPR:   NR HBsAg:   Negative  HIV:   Positive  GTT: 110 GBS:   Positive    Plan:  Taylor Oneal was discharged to home in good condition. Follow-up appointment at [facility] with Dr Taylor Oneal in 2 weeks for incision check   Discharge Medications: Oxycodone , motrin , colace , antivirals depoprovera ( prior to d./c )     Signed: Boykin Nearing MD

## 2016-06-23 NOTE — Discharge Instructions (Signed)

## 2016-06-23 NOTE — Anesthesia Procedure Notes (Signed)
Epidural Patient location during procedure: OB Start time: 06/23/2016 1:11 AM End time: 06/23/2016 1:19 AM  Staffing Anesthesiologist: Alvin Critchley Performed: anesthesiologist   Preanesthetic Checklist Completed: patient identified, site marked, surgical consent, pre-op evaluation, timeout performed, IV checked, risks and benefits discussed and monitors and equipment checked  Epidural Patient position: sitting Prep: Betadine and site prepped and draped Patient monitoring: heart rate, cardiac monitor, continuous pulse ox and blood pressure Approach: midline Location: L3-L4 Injection technique: LOR air  Needle:  Needle type: Tuohy  Needle gauge: 18 G Needle length: 9 cm Needle insertion depth: 6 cm Catheter type: closed end Catheter size: 20 Guage Test dose: negative and 1.5% lidocaine with Epi 1:200 K  Assessment Sensory level: T8  Additional Notes Time out called.  Patient placed in sitting position.  Back prepped and draped in sterile fashion.  A skin wheal was made with 1% Lidocaine at the L3-L4 interspace.  An 18G Tuohy needle was guided into the epidural space by a loss of resistance technique.  The epidural catheter was threaded 3 cm into the epidural space and the TD was negative.  No blood or paresthesias.  The patient tolerated the procedure well and the epidural was affixed to the back in a sterile fashion.Reason for block:procedure for pain

## 2016-06-23 NOTE — Anesthesia Postprocedure Evaluation (Signed)
Anesthesia Post Note  Patient: Taylor Oneal  Procedure(s) Performed: Procedure(s) (LRB): CESAREAN SECTION (N/A)  Anesthesia Post Evaluation  Last Vitals:  Vitals:   06/23/16 0516 06/23/16 0707  BP: 118/74   Pulse: 91   Resp:    Temp:  36.4 C    Last Pain:  Vitals:   06/23/16 0707  TempSrc: Oral  PainSc:                  Jonah Nestle, RON

## 2016-06-23 NOTE — Op Note (Signed)
Cesarean Section Procedure Note  06/22/2016 - 06/23/2016  Patient:  Taylor Oneal  35 y.o. female at [redacted]w[redacted]d Preoperative diagnosis:  1. fetal intolerance to labor 2. Fibroid uterus 3. HIV positive 4. Term intrauterine pregnancy  Postoperative diagnosis:  same, live born female  PROCEDURE:  Procedure(s): CESAREAN SECTION (N/A) Surgeon:  Surgeon(s) and Role:    * Daronte Shostak C Deangela Randleman, MD - Primary      Lars Pinks, CNM - Assisting  Anesthesia:  Epidural Antibiotics: Ancef 2g, Azithromycin 500mg  IV I/O: Total I/O In: 1000 [I.V.:1000] Out: 1400 [Urine:700; Emesis/NG output:100; Blood:600] Specimens:  Cord Blood, Complications: None Apparent Disposition:  VS stable to PACU  Findings: lobulated fibroid uterus, nuchal cord x1, Live born female  Birth Weight: 7 lb 11.5 oz (3500 g) APGAR: 8, 9   Indication for procedure: 35 y.o. female at [redacted]w[redacted]d admitted yesterday for SROM at 40+[redacted] weeks EGA.  Expectant management with persistent category 2 tracing, deep variables.  Amnioinfusion was started without significant improvement; deep variables still occurring with contractions to nadir of 50s.  Between variables, normal baseline and accelerations are present.  It was explained to patient that overall the FHT was reassuring and did not show signs of fetal compromise, that augmentation with pitocin would create more, stronger contractions, and these variables likely become more frequent; that this could either not change the status of the oxygen delivery or it could.  Patient prefers to not take that risk and asks for a primary cesarean delivery.  This was discussed between patient and CNM, and I was informed of her decision to proceed with CD.     Procedure Details   The risks, benefits, complications, treatment options, and expected outcomes were discussed with the patient. Informed consent was obtained. The patient was taken to Operating Room, identified as Mahitha Armenti and the procedure verified as a  cesarean delivery.   After administration of anesthesia, the patient was prepped and draped in the usual sterile manner, including a vaginal prep. A surgical time out was performed, with the pediatric team present. After confirming adequate anesthesia, a Pfannenstiel incision was made and carried down through the subcutaneous tissue to the fascia. Fascial incision was made and extended transversely. The fascia was separated from the underlying rectus tissue superiorly and inferiorly. The peritoneum was identified and entered. Peritoneal incision was extended longitudinally.  A low transverse uterine incision was made. The baby was direct OP with chin/mouth presented through hysterotomy.  Baby was rotated to allow for flexion of the head.  The head was delivered and nuchal cord (loose) was reduced.  The right, anterior shoulder was delivered, and the posterior shoulder was delivered after some maneuvering around.  Delivered from cephalic presentation was a live born female . Delayed cord clamping was performed for 30 seconds. The umbilical cord was doubly clamped and cut, and the baby was handed off to the awaitng pediatrician.  Cord blood was obtained for evaluation. The placenta was removed intact and appeared normal. Due to the irregular and enlarged shape, the uterus was left inside the abdominal cavity, and cleared of clots, membranes, and debris. The uterine incision was closed with running locking sutures of 0 Vicryl, and then a second, imbricating stitch was placed. Hemostasis was observed. The abdominal cavity was evacuated of extraneous fluid. The paracolic gutters were cleaned. The fascia was then reapproximated with running suture of vicryl. 60cc of Long- and short-acting bupivicaine was injected circumferentially into the fascia.  The subcutaneous tissue was irrigated and reapproximated with 3-0  vicryl. The skin was closed with 4-0 Monocryl and 40cc of long- and short-acting bupivacaine injected into the  skin and subcutaneous tissues.  The incision was covered with surgical glue.     Instrument, sponge, and needle counts were correct prior the abdominal closure and at the conclusion of the case.   I was present and performed this procedure in its entirety.  ----- Larey Days, MD Attending Obstetrician and Gynecologist [facility], Department of [facility]

## 2016-06-23 NOTE — Progress Notes (Signed)
Decision for cesarean:  Patient admitted yesterday for SROM at 40+[redacted] weeks EGA.  Expectant management with persistent category 2 tracing, deep variables.  Amnioinfusion was started without significant improvement; deep variables still occurring with contractions to nadir of 50s.  Between variables, normal baseline and accelerations are present.  It was explained to patient that overall the FHT was reassuring and did not show signs of fetal compromise, that augmentation with pitocin would create more, stronger contractions, and these variables likely become more frequent; that this could either not change the status of the oxygen delivery or it could.  Patient prefers to not take that risk and asks for a primary cesarean delivery.  This was discussed between patient and CNM, and I was informed of her decision to proceed with CD.    OR staff notified, NICU informed of impending delivery and HIV+ status, and preparations are underway.  Consents signed.  Risks of bleeding, infection, unintended injury to nearby organs (bladder, bowel, etc), and blood clot were reviewed with patient.  ----- Larey Days, MD Attending Obstetrician and Gynecologist [facility], Department of [facility]

## 2016-06-23 NOTE — Progress Notes (Signed)
S:  Not tolerating ctxs with IV pain medication      Not coping well       4 minute deceleration at 2332 - IVF bolus, position change, and oxygen by face mask with eventual return to baseline   O:  VS: Blood pressure 113/63, pulse 71, temperature 97.9 F (36.6 C), temperature source Oral, resp. rate 18, height 5\' 4"  (1.626 m), weight 88 kg (194 lb).        FHR : baseline 120 bpm / variability moderate / accelerations + / occasional variable decelerations        Toco: contractions every 3-6 minutes / moderate        Cervix : .2cm/90%/-1/vtx        Membranes: SROM - clear/pink   A: Latent labor     FHR category 2  P: Dr. Leonides Schanz notified of [place] for epidural to get her comfortable     Will place IUPC and begin amnioinfusion after she is comfortable     Anesthesia notified      Continue to monitor closely  Lars Pinks, CNM

## 2016-06-23 NOTE — Transfer of Care (Signed)
Immediate Anesthesia Transfer of Care Note  Patient: Taylor Oneal  Procedure(s) Performed: Procedure(s): CESAREAN SECTION (N/A)  Patient Location: PACU and Mother/Baby  Anesthesia Type:Epidural  Level of Consciousness: awake, alert  and oriented  Airway & Oxygen Therapy: Patient Spontanous Breathing and Patient connected to nasal cannula oxygen  Post-op Assessment: Report given to RN and Post -op Vital signs reviewed and stable  Post vital signs: Reviewed and stable  Last Vitals:  Vitals:   06/23/16 0419 06/23/16 0516  BP: (!) 105/56 118/74  Pulse: 80 91  Resp:    Temp:      Last Pain:  Vitals:   06/23/16 0132  TempSrc: Oral  PainSc:          Complications: No apparent anesthesia complications

## 2016-06-23 NOTE — Clinical Social Work Maternal (Signed)
  CLINICAL SOCIAL WORK MATERNAL/CHILD NOTE  Patient Details  Name: Taylor Oneal MRN: [id number] Date of Birth: 11-25-1980  Date:  06/23/2016  Clinical Social Worker Initiating Note:  Santiago Bumpers, MSW, LCSW-A  Date/ Time Initiated:  06/23/16/1451     Child's Name:  [name]   Legal Guardian:  Mother   Need for Interpreter:  None   Date of Referral:  06/23/16     Reason for Referral:  Newly Diagnosed HIV    Referral Source:  RN   Address:  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  Strengths:  Ability to meet basic needs , Compliance with medical plan , Home prepared for child , Understanding of illness, Other (Comment) (Multiple Social Supports, Employed)   Risk Factors/Current Problems:  Adjustment to Illness    Cognitive State:  Alert , Able to Concentrate    Mood/Affect:  Calm , Comfortable , Happy    CSW Assessment: Patient was resting comfortably when CSW visited bedside. She was soon bonding with her baby, and she reported that the FOB had left the room momentarily to have lunch.  The patient was aware of her HIV+ status, and she has already begun the [facility] process. She was able to verbalize the heightened risk factors for her child in her own terms, and she understood that she would need to have her child tested for HIV every 2 months. The patient also understood and was able to  verbalize the signs of postpartum depression and anxiety, and she was able to name different sources for support in that event. The patient has a crib and car seat, and she began preparing for her child in the second trimester. The patient indicated that she and her significant other were quite excited about the newborn. She and the FOB reside together, and both of them work full-time. She is planning on using [facility]'s daycare system once her maternity leave ends.  CSW Plan/Description:  Patient/Family Education     Zettie Pho, LCSW 06/23/2016, 2:55 PM

## 2016-06-23 NOTE — Progress Notes (Addendum)
S:  Pt. Had epidural at 0111 and is now comfortable       Treated with ephedrine for hypotension       4.5 minute decel at 0205 that resolved with position change, IVF bolus and oxygen by face mask  O:  VS: Blood pressure 109/62, pulse 83, temperature 99.3 F (37.4 C), temperature source Oral, resp. rate 18, height 5\' 4"  (1.626 m), weight 88 kg (194 lb), SpO2 100 %.        FHR : baseline 120 bpm / variability moderate / accelerations + / variable decelerations        Toco: contractions every 5 minutes / mild-moderate         Cervix : 2.5cm/90%/-1/vtx        Membranes: SROM - pink/clear  A: Latent labor     FHR category 2  P: IUPC successfully placed with ease    Amnioinfusion 396mL bolus, then 161mL/hr     Dr. Leonides Schanz Notified of decel, with now reassuring monitoring - okay to continue laboring     Monitor closely   Lars Pinks, CNM  0300: Dr. Leonides Schanz notified of reassuring monitoring.  Okay to restart Pitocin.   Lars Pinks, CNM

## 2016-06-23 NOTE — Anesthesia Preprocedure Evaluation (Addendum)
Anesthesia Evaluation  Patient identified by MRN, date of birth, ID band Patient awake    Reviewed: Allergy & Precautions, NPO status , Patient's Chart, lab work & pertinent test results  Airway Mallampati: II       Dental no notable dental hx.    Pulmonary asthma ,    Pulmonary exam normal        Cardiovascular negative cardio ROS Normal cardiovascular exam     Neuro/Psych negative neurological ROS  negative psych ROS   GI/Hepatic negative GI ROS, Neg liver ROS,   Endo/Other  Hypothyroidism   Renal/GU negative Renal ROS  negative genitourinary   Musculoskeletal negative musculoskeletal ROS (+)   Abdominal Normal abdominal exam  (+)   Peds negative pediatric ROS (+)  Hematology  (+) HIV,   Anesthesia Other Findings   Reproductive/Obstetrics (+) Pregnancy                             Anesthesia Physical Anesthesia Plan  ASA: II  Anesthesia Plan: Epidural   Post-op Pain Management:    Induction:   Airway Management Planned: Natural Airway  Additional Equipment:   Intra-op Plan:   Post-operative Plan:   Informed Consent: I have reviewed the patients History and Physical, chart, labs and discussed the procedure including the risks, benefits and alternatives for the proposed anesthesia with the patient or authorized representative who has indicated his/her understanding and acceptance.   Dental advisory given  Plan Discussed with: CRNA and Surgeon  Anesthesia Plan Comments:         Anesthesia Quick Evaluation

## 2016-06-23 NOTE — Progress Notes (Addendum)
S:  Called by RN for recurrent variable decelerations to nadir of 50 bpm, lasting 50-80 sec. The patient is very anxious about her baby's status and is concerned about continuing to labor.   O:  VS: Blood pressure 114/72, pulse 95, temperature 99.3 F (37.4 C), temperature source Oral, resp. rate 18, height 5\' 4"  (1.626 m), weight 88 kg (194 lb), SpO2 100 %.        FHR : baseline 120 bpm / variability minimal to moderate / accelerations + / variable decelerations        Toco: contractions every 7-10 minutes / mild-moderate / MVU - 80        Cervix : Dilation: 2.5 Effacement (%): 90 Cervical Position: Anterior Station: -1 Presentation: Vertex Exam by:: M. Sigmond        Membranes: SROM - clear fluid / bloody show  A: Latent labor     FHR category 2 - addendum to note, mistakenly typed category 3, but FHR is category 2.  Dr. Leonides Schanz reviewed as well.  See note below.   P: Called Dr. Leonides Schanz with status update.  Unable to restart Pitocin due to deep variables.  No cervical change.  Recommend to proceed with primary cesarean delivery.  OR team notified.  Dr. Leonides Schanz on her way.   Lars Pinks, CNM      I reveiewed tracing and FHR is category 2, not 3.   Chelsea Ward

## 2016-06-23 NOTE — Clinical Social Work Note (Signed)
CSW received consult. CSW will visit mother once she is stable post-op to assess. CSW following.  Santiago Bumpers, MSW, LCSW-A [phone]

## 2016-06-24 LAB — CBC
HCT: 23 % — ABNORMAL LOW (ref 35.0–47.0)
HEMOGLOBIN: 8 g/dL — AB (ref 12.0–16.0)
MCH: 32.4 pg (ref 26.0–34.0)
MCHC: 34.7 g/dL (ref 32.0–36.0)
MCV: 93.3 fL (ref 80.0–100.0)
Platelets: 121 10*3/uL — ABNORMAL LOW (ref 150–440)
RBC: 2.46 MIL/uL — AB (ref 3.80–5.20)
RDW: 12.7 % (ref 11.5–14.5)
WBC: 6.3 10*3/uL (ref 3.6–11.0)

## 2016-06-24 MED ORDER — FERROUS SULFATE 325 (65 FE) MG PO TABS
325.0000 mg | ORAL_TABLET | Freq: Two times a day (BID) | ORAL | Status: DC
  Administered 2016-06-24 – 2016-06-25 (×3): 325 mg via ORAL
  Filled 2016-06-24 (×3): qty 1

## 2016-06-24 MED ORDER — BISACODYL 10 MG RE SUPP
10.0000 mg | Freq: Once | RECTAL | Status: AC
  Administered 2016-06-24: 10 mg via RECTAL
  Filled 2016-06-24: qty 1

## 2016-06-24 NOTE — Progress Notes (Signed)
POSTOPERATIVE DAY # 1 S/P Primary LTCS for fetal intolerance to labor, HIV positive    S:         Reports feeling very sore  +flatus pain, not passing flatus yet              Tolerating po intake / no nausea / no vomiting / no flatus / no BM             Bleeding is light             Pain controlled withMotrin, Tylenol, and Roxicodone              Up ad lib / ambulatory/ voiding QS  Newborn formula feeding - breastfeeding contraindicated with HIV positive    O:  VS: BP 106/78 (BP Location: Left Arm)   Pulse 97   Temp 98.3 F (36.8 C) (Oral)   Resp 18   Ht 5\' 4"  (1.626 m)   Wt 88 kg (194 lb)   SpO2 100%   Breastfeeding? Unknown   BMI 33.30 kg/m    LABS:               Recent Labs  06/22/16 1400 06/24/16 0550  WBC 4.1 6.3  HGB 11.6* 8.0*  PLT 153 121*               Bloodtype: --/--/O POS (09/22 1400)  Rubella:                                               I&O: Intake/Output      09/23 0701 - 09/24 0700 09/24 0701 - 09/25 0700   I.V. (mL/kg) 603 (6.9)    Total Intake(mL/kg) 603 (6.9)    Urine (mL/kg/hr) 1975 (0.9)    Emesis/NG output     Blood     Total Output 1975     Net -1372                       Physical Exam:             Alert and Oriented X3  Lungs: Clear and unlabored  Heart: regular rate and rhythm / no mumurs  Abdomen: soft, non-tender, non-distended, hypoactive bowel sounds               Fundus: firm, non-tender, U-E             Dressing: small drainage from incision              Incision:  approximated with sutures / no erythema / no ecchymosis / +small serosanguinous drainage  Perineum: intact  Lochia: small  Extremities: no edema, no calf pain or tenderness  A:        POD # 1 S/P Primary LTCS for fetal intolerance to labor  HIV positive             Hypoactive bowel sounds with flatus pain   ABL Anemia   P:        Routine postoperative care              Dulcolax suppository x 1   Ambulation encouraged   Warm liquids, shower for flatus pain    Started on Ferrous Sulfate BID   Continue current therapy  Anticipate dc home on Tuesday   Lars Pinks, [place]

## 2016-06-25 ENCOUNTER — Encounter: Payer: Self-pay | Admitting: Obstetrics & Gynecology

## 2016-06-25 MED ORDER — DOCUSATE SODIUM 100 MG PO CAPS
100.0000 mg | ORAL_CAPSULE | Freq: Two times a day (BID) | ORAL | 0 refills | Status: DC
Start: 2016-06-25 — End: 2019-03-30

## 2016-06-25 MED ORDER — MEDROXYPROGESTERONE ACETATE 150 MG/ML IM SUSP
150.0000 mg | Freq: Once | INTRAMUSCULAR | Status: AC
  Administered 2016-06-25: 150 mg via INTRAMUSCULAR
  Filled 2016-06-25: qty 1

## 2016-06-25 MED ORDER — OXYCODONE HCL 5 MG PO TABS: 5.0000 mg | ORAL_TABLET | ORAL | 0 refills | Status: DC | PRN

## 2016-06-25 MED ORDER — IBUPROFEN 600 MG PO TABS: 600.0000 mg | ORAL_TABLET | Freq: Four times a day (QID) | ORAL | 0 refills | Status: DC | PRN

## 2016-06-25 NOTE — Progress Notes (Signed)
Late entry interval note:   Called to come evaluate c/s incision.  Small serosanguinous drainage noted earlier today and by nurse this morning, has ceased now.  Incision, soft, not indurated, no evidence of hematoma or seroma.  Pressure dressing with abd pad applied.  Pt. Reports passing flatus and having a bowel movement after suppository and feeling much better.  Will continue to monitor.  Lars Pinks, CNM

## 2016-06-25 NOTE — Progress Notes (Signed)
All discharge instructions given to patient and she voices understanding of all instructions given. She will make her f/u appt for 2 weeks for incision check and her 6 week f/u.  Prescriptions given.  Patient discharged home with infant and spouse escorted out by auxillary

## 2017-04-17 ENCOUNTER — Emergency Department
Admission: EM | Admit: 2017-04-17 | Discharge: 2017-04-17 | Disposition: A | Discharge status: Home / Self Care | Payer: Commercial Managed Care - PPO | Attending: Emergency Medicine | Admitting: Emergency Medicine

## 2017-04-17 ENCOUNTER — Emergency Department: Payer: Commercial Managed Care - PPO

## 2017-04-17 DIAGNOSIS — Z3A00 Weeks of gestation of pregnancy not specified: Secondary | ICD-10-CM | POA: Diagnosis not present

## 2017-04-17 DIAGNOSIS — O200 Threatened abortion: Secondary | ICD-10-CM

## 2017-04-17 DIAGNOSIS — D259 Leiomyoma of uterus, unspecified: Secondary | ICD-10-CM | POA: Insufficient documentation

## 2017-04-17 DIAGNOSIS — O4690 Antepartum hemorrhage, unspecified, unspecified trimester: Secondary | ICD-10-CM | POA: Diagnosis present

## 2017-04-17 DIAGNOSIS — B20 Human immunodeficiency virus [HIV] disease: Secondary | ICD-10-CM | POA: Insufficient documentation

## 2017-04-17 DIAGNOSIS — Z79899 Other long term (current) drug therapy: Secondary | ICD-10-CM | POA: Insufficient documentation

## 2017-04-17 DIAGNOSIS — J449 Chronic obstructive pulmonary disease, unspecified: Secondary | ICD-10-CM | POA: Diagnosis not present

## 2017-04-17 LAB — BASIC METABOLIC PANEL
ANION GAP: 8 (ref 5–15)
BUN: 7 mg/dL (ref 6–20)
CHLORIDE: 102 mmol/L (ref 101–111)
CO2: 24 mmol/L (ref 22–32)
Calcium: 9.2 mg/dL (ref 8.9–10.3)
Creatinine, Ser: 0.48 mg/dL (ref 0.44–1.00)
GFR calc Af Amer: >60 mL/min (ref >60)
Glucose, Bld: 85 mg/dL (ref 65–99)
POTASSIUM: 3.4 mmol/L — AB (ref 3.5–5.1)
SODIUM: 134 mmol/L — AB (ref 135–145)

## 2017-04-17 LAB — HCG, QUANTITATIVE, PREGNANCY: hCG, Beta Chain, Quant, S: 55160 m[IU]/mL — ABNORMAL HIGH (ref <5)

## 2017-04-17 LAB — CBC
HCT: 34.8 % — ABNORMAL LOW (ref 35.0–47.0)
HEMOGLOBIN: 11.9 g/dL — AB (ref 12.0–16.0)
MCH: 29.5 pg (ref 26.0–34.0)
MCHC: 34.3 g/dL (ref 32.0–36.0)
MCV: 86.2 fL (ref 80.0–100.0)
PLATELETS: 175 10*3/uL (ref 150–440)
RBC: 4.03 MIL/uL (ref 3.80–5.20)
RDW: 13.1 % (ref 11.5–14.5)
WBC: 3.5 10*3/uL — ABNORMAL LOW (ref 3.6–11.0)

## 2017-04-17 LAB — POCT PREGNANCY, URINE: PREG TEST UR: POSITIVE — AB

## 2017-04-17 NOTE — ED Notes (Signed)
First nurse note  Presents with some vaginal spotting  LMP was in April  Had positive home preg test

## 2017-04-17 NOTE — ED Provider Notes (Signed)
Ultrasound shows live IUP. Patient updated, results discussed with her, recommended close follow-up with obstetrics. She saw [facility] clinic OB in the past and will follow up with them. She has an appointment in early August, I recommended she have it moved up as close as possible, preferably in 2 days. She is calm and comfortable. Bleeding likely from her history of uterine fibroids. No hemorrhage noted on ultrasound.   Carrie Mew, MD 04/17/17 [phone]

## 2017-04-17 NOTE — ED Triage Notes (Signed)
Pt presents to ED for light spotting x 2.5 weeks. Pt states LMP April 19th. positive home pregnancy test. Denies any pain. Has 1 living child, previous C section. HIV positive. Alert, oriented, ambulatory.

## 2017-04-17 NOTE — Discharge Instructions (Signed)
Results for orders placed or performed during the hospital encounter of 04/17/17  hCG, quantitative, pregnancy  Result Value Ref Range   hCG, Beta Chain, Quant, S 55,160 (H) <5 mIU/mL  CBC  Result Value Ref Range   WBC 3.5 (L) 3.6 - 11.0 K/uL   RBC 4.03 3.80 - 5.20 MIL/uL   Hemoglobin 11.9 (L) 12.0 - 16.0 g/dL   HCT 34.8 (L) 35.0 - 47.0 %   MCV 86.2 80.0 - 100.0 fL   MCH 29.5 26.0 - 34.0 pg   MCHC 34.3 32.0 - 36.0 g/dL   RDW 13.1 11.5 - 14.5 %   Platelets 175 150 - 440 K/uL  Basic metabolic panel  Result Value Ref Range   Sodium 134 (L) 135 - 145 mmol/L   Potassium 3.4 (L) 3.5 - 5.1 mmol/L   Chloride 102 101 - 111 mmol/L   CO2 24 22 - 32 mmol/L   Glucose, Bld 85 65 - 99 mg/dL   BUN 7 6 - 20 mg/dL   Creatinine, Ser 0.48 0.44 - 1.00 mg/dL   Calcium 9.2 8.9 - 10.3 mg/dL   GFR calc non Af Amer >60 >60 mL/min   GFR calc Af Amer >60 >60 mL/min   Anion gap 8 5 - 15  Pregnancy, urine POC  Result Value Ref Range   Preg Test, Ur POSITIVE (A) NEGATIVE   US Ob Comp Less 14 Wks  Result Date: 04/17/2017 CLINICAL DATA:  Vaginal bleeding for 3 weeks. Gestational age by LMP of 12 weeks 6 days. EXAM: OBSTETRIC <14 WK ULTRASOUND TECHNIQUE: Transabdominal ultrasound was performed for evaluation of the gestation as well as the maternal uterus and adnexal regions. COMPARISON:  None. FINDINGS: Intrauterine gestational sac: Single Yolk sac:  Visualized. Embryo:  Visualized. Cardiac Activity: Visualized. Heart Rate: 160 bpm CRL:   42  mm   11 w 1 d                  [place] EDC: 11/05/2017 Subchorionic hemorrhage:  None visualized. Maternal uterus/adnexae: At least 3 small fibroids are seen, largest on the uterine fundus measuring 3.2 cm. Both ovaries are normal appearance. No adnexal mass or abnormal free fluid identified. IMPRESSION: Single living IUP measuring 11 weeks 1 day, with [place] EDC of 11/05/2017. Several small uterine fibroids, largest measuring 3.2 cm. Electronically Signed   By: Earle Gell M.D.   On:  04/17/2017 15:13

## 2017-04-17 NOTE — ED Provider Notes (Signed)
[facility] Emergency Department Provider Note   ____________________________________________    I have reviewed the triage vital signs and the nursing notes.   HISTORY  Chief Complaint Vaginal Bleeding     HPI Taylor Oneal is a 36 y.o. female who presents with complaints of spotting over the last 2 weeks. Patient reports her last period was at the end of April, she took a pregnancy test which is positive earlier this week. She reports a historyof fibroids, denies abdominal pain. No fevers or chills. No vaginal discharge besides the spotting. G3 P1, had a miscarriage   Past Medical History:  Diagnosis Date  . Asthma    long time ago per patient   . HIV (human immunodeficiency virus infection) ([place])   . Thyroid disease     Patient Active Problem List   Diagnosis Date Noted  . HIV disease affecting pregnancy 06/23/2016  . Anemia affecting pregnancy 06/23/2016  . Fibroid uterus 06/23/2016  . GBS (group B Streptococcus carrier), +RV culture, currently pregnant 06/23/2016  . Asthma affecting pregnancy, antepartum 06/23/2016  . Supervision of high risk pregnancy in third trimester 06/23/2016  . Labor and delivery, indication for care 06/22/2016    Past Surgical History:  Procedure Laterality Date  . CESAREAN SECTION N/A 06/23/2016   Procedure: CESAREAN SECTION;  Surgeon: Honor Loh Ward, MD;  Location: [facility] ORS;  Service: Obstetrics;  Laterality: N/A;    Prior to Admission medications   Medication Sig Start Date End Date Taking? Authorizing Provider  docusate sodium (COLACE) 100 MG capsule Take 1 capsule (100 mg total) by mouth 2 (two) times daily. 06/25/16   Schermerhorn, Gwen Her, MD  ibuprofen (ADVIL,MOTRIN) 600 MG tablet Take 1 tablet (600 mg total) by mouth every 6 (six) hours as needed for mild pain. 06/25/16   Schermerhorn, Gwen Her, MD  oxyCODONE (OXY IR/ROXICODONE) 5 MG immediate release tablet Take 1 tablet (5 mg total) by mouth every  4 (four) hours as needed for moderate pain (pain scale 4-7). 06/25/16   Schermerhorn, Gwen Her, MD  Prenatal Vit-Fe Fumarate-FA (PRENATAL MULTIVITAMIN) TABS tablet Take 1 tablet by mouth daily at 12 noon.    [provider]  raltegravir (ISENTRESS) 400 MG tablet Take 400 mg by mouth 2 (two) times daily.    [provider]     Allergies Patient has no known allergies.  Family History  Problem Relation Age of Onset  . Hypertension Mother   . Diabetes Father   . Hypertension Father   . Hypertension Maternal Grandfather     Social History Social History  Substance Use Topics  . Smoking status: Never Smoker  . Smokeless tobacco: Never Used  . Alcohol use No    Review of Systems  Constitutional: No fever/chills Eyes: No visual changes.  ENT: No sore throat. Cardiovascular: Denies chest pain. Respiratory: Denies shortness of breath. Gastrointestinal: No abdominal pain.  Genitourinary: Negative for dysuria.Spotting as above Musculoskeletal: Negative for back pain. Skin: Negative for rash. Neurological: Negative for headaches or weakness   ____________________________________________   PHYSICAL EXAM:  VITAL SIGNS: ED Triage Vitals [04/17/17 1134]  Enc Vitals Group     BP 129/72     Pulse Rate 84     Resp 18     Temp 98.8 F (37.1 C)     Temp Source Oral     SpO2 100 %     Weight      Height      Head Circumference  Peak Flow      Pain Score 0     Pain Loc      Pain Edu?      Excl. in [place]?     Constitutional: Alert and oriented. No acute distress. Pleasant and interactive Eyes: Conjunctivae are normal.  Head: Atraumatic.   Cardiovascular: Normal rate, regular rhythm. Grossly normal heart sounds.  Good peripheral circulation. Respiratory: Normal respiratory effort.  No retractions. Lungs CTAB. Gastrointestinal:  No distention Genitourinary: deferred Musculoskeletal: Warm and well perfused Neurologic:  Normal speech and language. No  gross focal neurologic deficits are appreciated.  Skin:  Skin is warm, dry and intact. No rash noted. Psychiatric: Mood and affect are normal. Speech and behavior are normal.  ____________________________________________   LABS (all labs ordered are listed, but only abnormal results are displayed)  Labs Reviewed  HCG, QUANTITATIVE, PREGNANCY - Abnormal; Notable for the following:       Result Value   hCG, Beta Chain, Quant, S 55,160 (*)    All other components within normal limits  CBC - Abnormal; Notable for the following:    WBC 3.5 (*)    Hemoglobin 11.9 (*)    HCT 34.8 (*)    All other components within normal limits  BASIC METABOLIC PANEL - Abnormal; Notable for the following:    Sodium 134 (*)    Potassium 3.4 (*)    All other components within normal limits  POCT PREGNANCY, URINE - Abnormal; Notable for the following:    Preg Test, Ur POSITIVE (*)    All other components within normal limits  POC URINE PREG, ED   ____________________________________________  EKG  None ____________________________________________  RADIOLOGY  Ultrasound pending ____________________________________________   PROCEDURES  Procedure(s) performed: No    Critical Care performed: No ____________________________________________   INITIAL IMPRESSION / ASSESSMENT AND PLAN / ED COURSE  Pertinent labs & imaging results that were available during my care of the patient were reviewed by me and considered in my medical decision making (see chart for details).  Patient presented with spotting over the last 2 weeks, has not had an ultrasound this pregnancy. Review of records demonstrates she is O+.  Will obtain ultrasound    ____________________________________________   FINAL CLINICAL IMPRESSION(S) / ED DIAGNOSES  Final diagnoses:  Vaginal bleeding in pregnancy      NEW MEDICATIONS STARTED DURING THIS VISIT:  New Prescriptions   No medications on file     Note:   This document was prepared using Dragon voice recognition software and may include unintentional dictation errors.    Lavonia Drafts, MD 04/17/17 [phone]

## 2017-07-08 IMAGING — US US OB COMP +14 WK
1 series · 15 of 28 positions shown · non-contrast
Comparison: none

CLINICAL DATA: Uterine leiomyomas.  Evaluate growth.

EXAM:
OBSTETRICAL ULTRASOUND >14 WKS

[Series 1: us ob comp +14 wk · 15 of 69 slices shown]
[im 1/69]
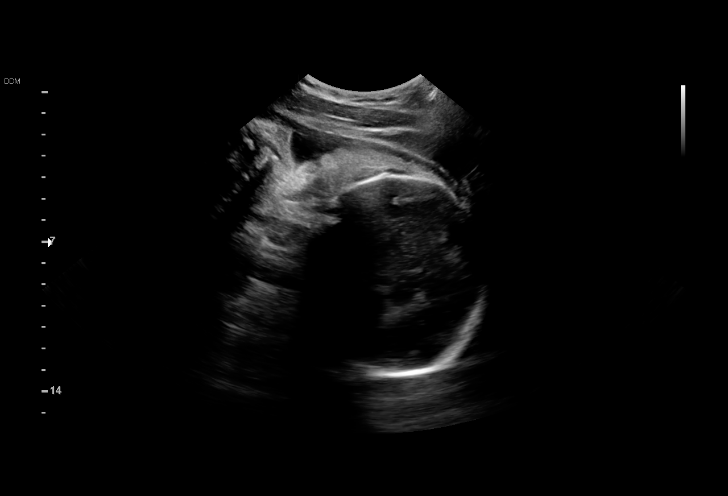
[im 6/69]
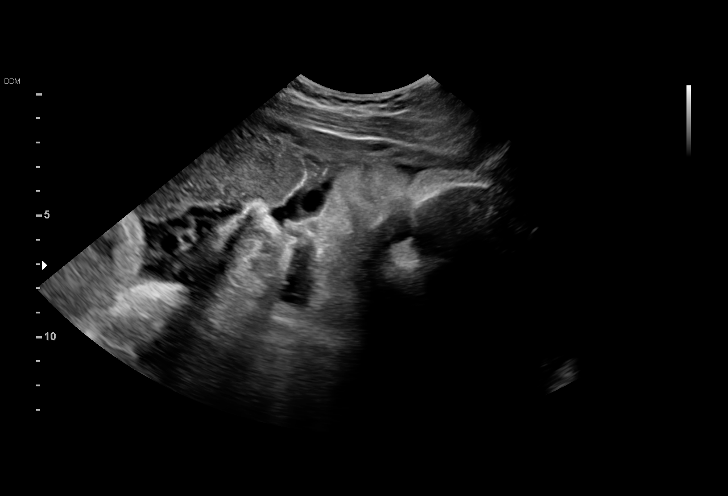
[im 11/69]
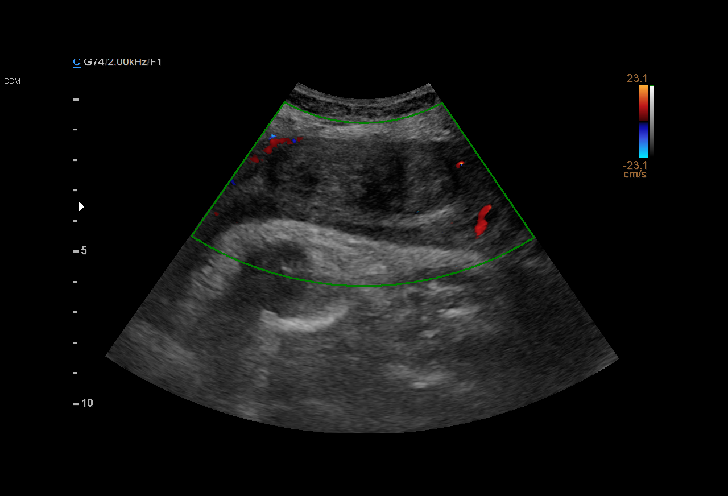
[im 16/69]
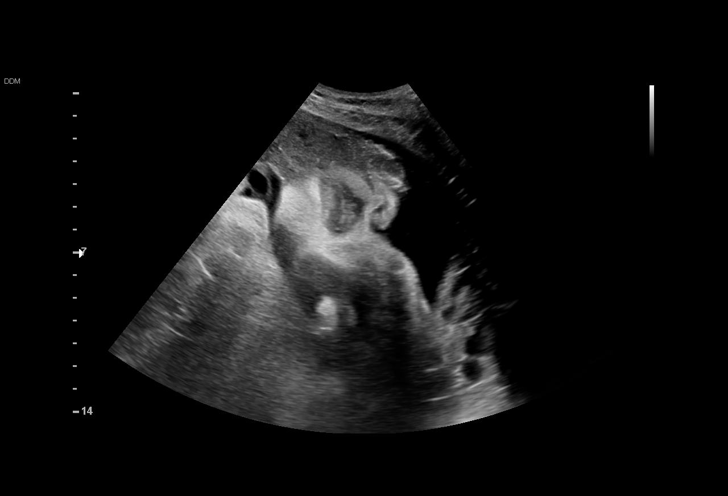
[im 21/69]
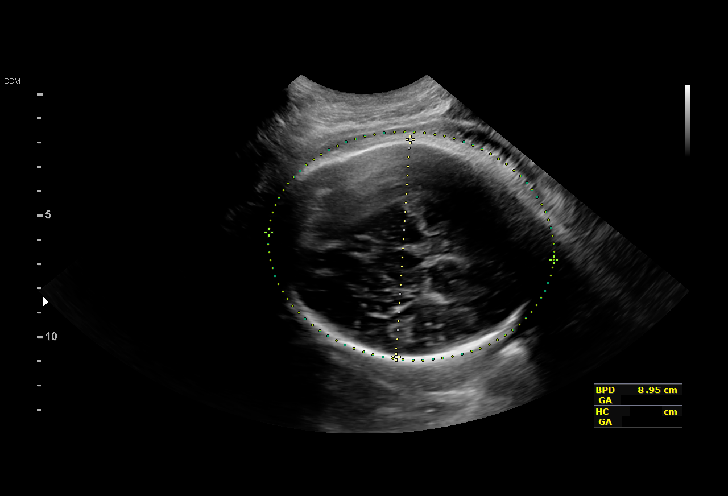
[im 26/69]
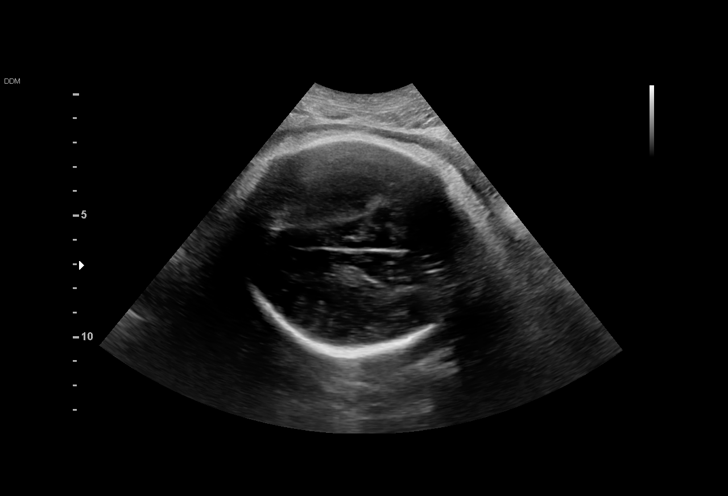
[im 31/69]
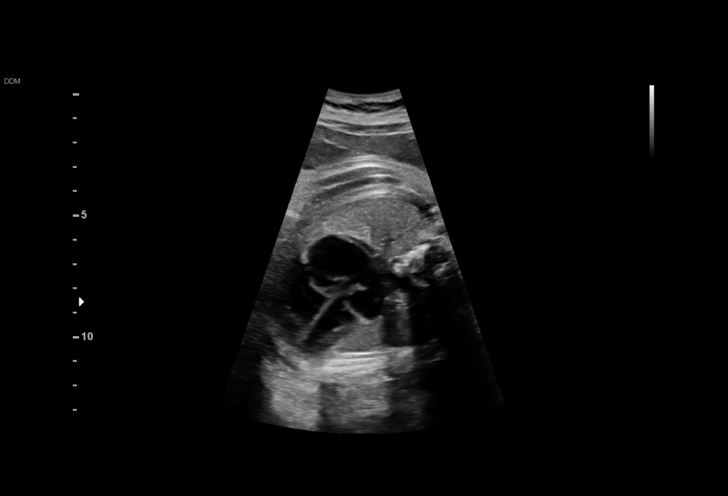
[im 36/69]
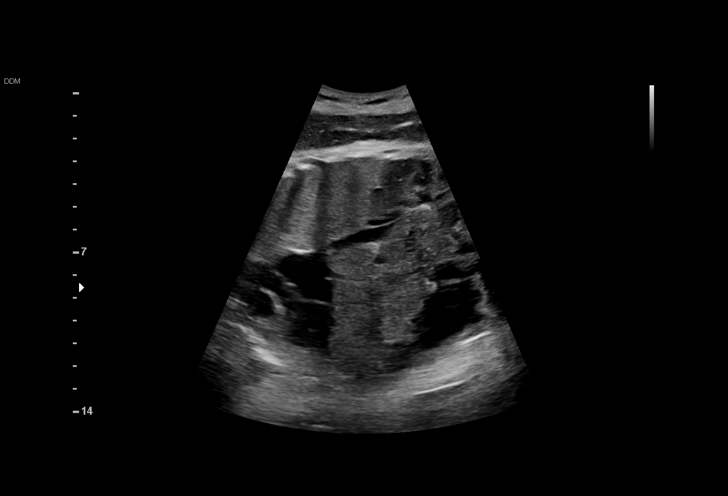
[im 38/69]
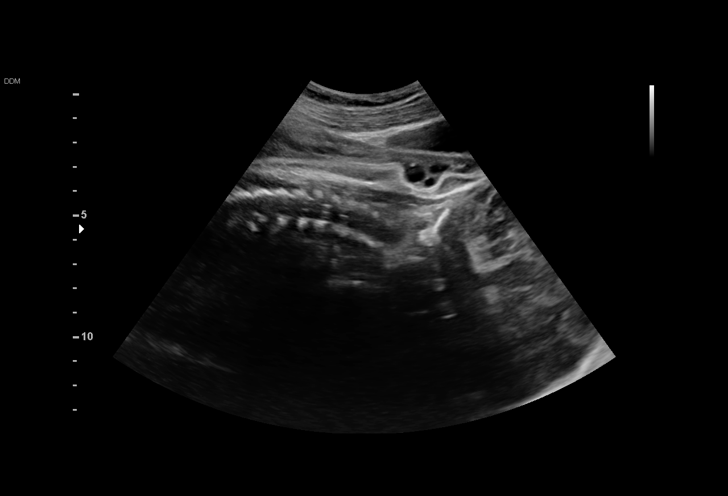
[im 43/69]
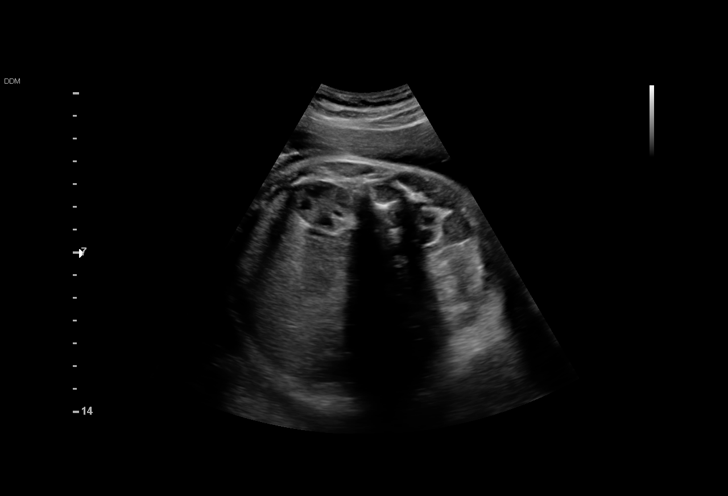
[im 48/69]
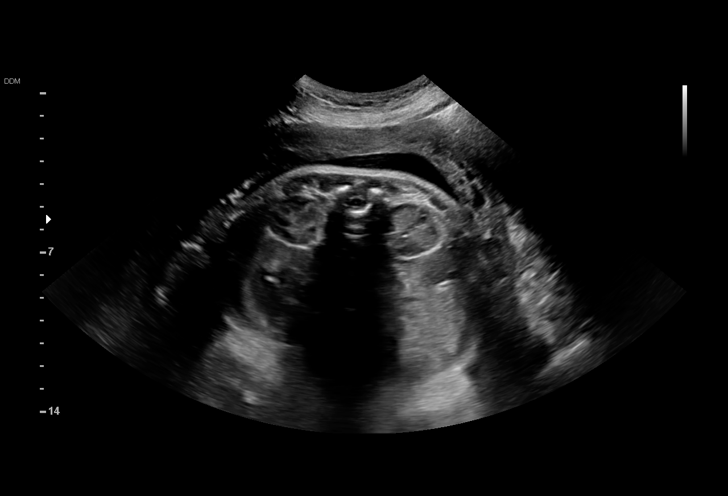
[im 53/69]
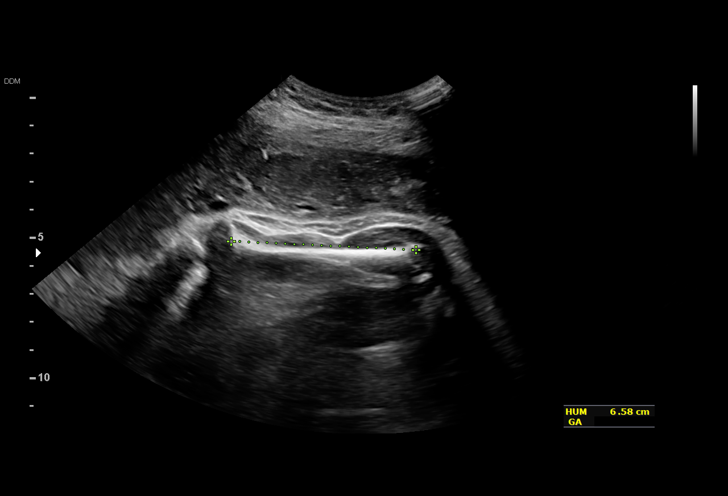
[im 58/69]
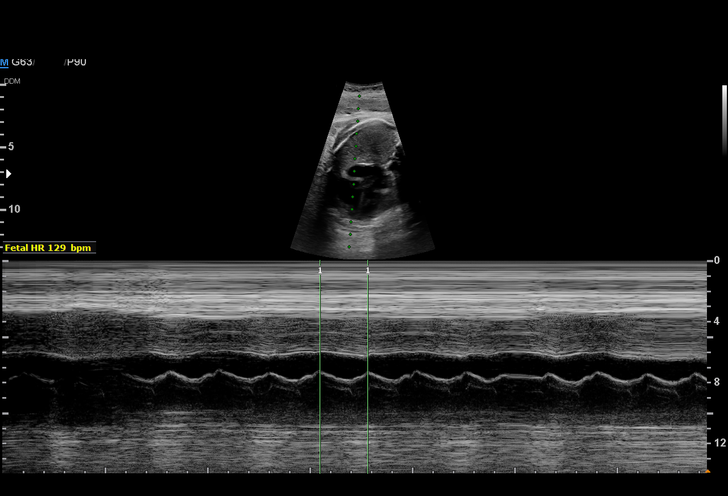
[im 63/69]
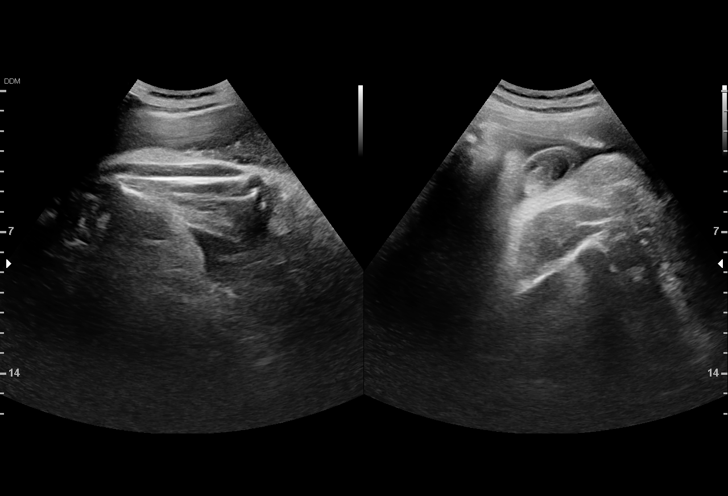
[im 69/69]
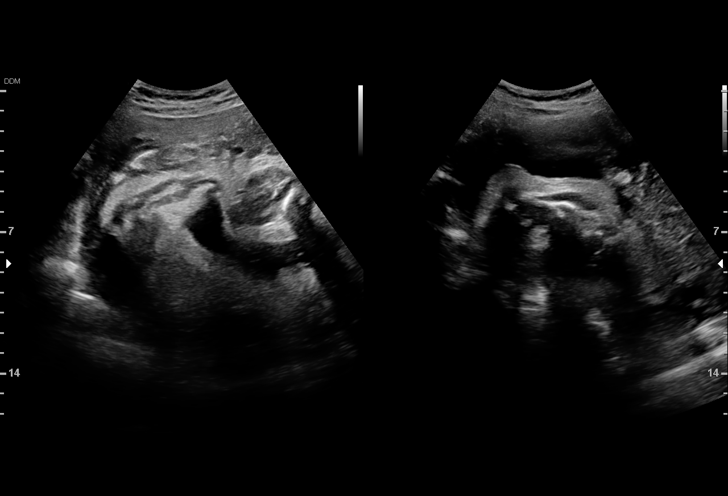

[15 of 28 positions shown; findings below may reference images not displayed]

FINDINGS: Number of Fetuses: 1

Heart Rate:  129 bpm

Movement: Yes

Presentation: Cephalic

Previa: No

Placental Location: Anterior

Amniotic Fluid (Objective):

Vertical pocket 4.2cm

AFI 9.7 cm (5%ile= 7.2 cm, 95%= 25.5 cm for 39 wks)

FETAL BIOMETRY

BPD:  9.5cm 37w 0d

HC:    33.9cm  38w   6d

AC:   36.1cm  40w   0d

FL:   7.4cm  37w   5d

Current Mean GA: 38w 3d              US EDC: 06/26/2016

Estimated Fetal Weight:  3670g    83%ile

FETAL ANATOMY

Lateral Ventricles: Appears normal

Thalami/CSP: Appears normal

Posterior Fossa:  Not well visualized

Nuchal Region: Not well visualized

Upper Lip: Appears normal

Spine: Appears normal

4 Chamber Heart on Left: Appears normal

LVOT: Appears normal

RVOT: Appears normal

Stomach on Left: Appears normal

3 Vessel Cord: Appears normal

Cord Insertion site: Not well visualized

Kidneys: Appears normal

Bladder: Appears normal

Extremities: Not well visualized

Sex: Male

Technically difficult due to: Evaluation is limited secondary to
advanced gestational age and 2 uterine fibroids. Anterior mid
fibroid measures 6.8 x 6.1 x 2.7 cm. Anterior left fibroid measures
3.6 x 1.8 cm.

Maternal Findings:

Cervix:  Not well-visualized.
IMPRESSION: 1. Single live intrauterine pregnancy as detailed above.
2. 2 uterine fibroids measuring 6.8 x 6.1 x 2.7 cm and 3.6 x 1.8 cm
respectively.

## 2017-07-25 ENCOUNTER — Ambulatory Visit: Payer: Commercial Managed Care - PPO

## 2017-07-29 ENCOUNTER — Ambulatory Visit: Admission: RE | Admit: 2017-07-29 | Payer: Commercial Managed Care - PPO | Source: Ambulatory Visit

## 2017-10-10 ENCOUNTER — Ambulatory Visit: Payer: Commercial Managed Care - PPO

## 2017-10-30 NOTE — H&P (Signed)
OB History & Physical   History of Present Illness:  Chief Complaint:   HPI:  Taylor Oneal is a 37 y.o. G74P1001 female at [redacted]w[redacted]d dated by LMP c/w 2nd trimester [place].  She presents to L&D for scheduled cesarean.  +FM, no CTX, no LOF, no VB  Pregnancy Issues: 1. HIV positive, with drug resistance, on 3-drug regimen with viral load 10/11/17 of 300.   2. Poor compliance 3. History or cesarean 4. History of hyperthyroid 5. Uterine fibroids 6. History of Depression 7. Anemia    Maternal Medical History:       Past Medical History:  Diagnosis Date  . Asthma without status asthmaticus, unspecified   . Depression, unspecified    thoughts of suicide in 01/23/14 after death of brother but no attempt  . HIV infection (CMS-HCC)   . Thyroid disease    hyperthyroidism - "attempted a procedure but did not complete"         Past Surgical History:  Procedure Laterality Date  . CESAREAN SECTION      No Known Allergies       Prior to Admission medications   Medication Sig Taking? Last Dose  emtricitabine-tenofovir, TDF, (TRUVADA) 200-300 mg tablet Take 1 tablet by mouth once daily. Yes Taking  prenatal vit-iron fumarate-FA (PRENAVITE) tablet Take 1 tablet by mouth once daily. Yes Taking  raltegravir (ISENTRESS) 400 mg tablet Take 1 tablet (400 mg total) by mouth 2 (two) times daily. Yes Taking     Prenatal care site: [place] History: She  reports that she has never smoked. She has never used smokeless tobacco. She reports that she does not drink alcohol or use drugs.  Family History: family history includes Diabetes in her father; High blood pressure (Hypertension) in her mother.   Review of Systems: A full review of systems was performed and negative except as noted in the HPI.     Physical Exam:  Vital Signs: BP 110/74   Pulse 88   Ht 154.9 cm (5\' 1" )   Wt 87.9 kg (193 lb 12.8 oz)   LMP 01/16/2017 (Approximate)    Breastfeeding? No   BMI 36.62 kg/m  General: no acute distress.  HEENT: normocephalic, atraumatic Heart: regular rate & rhythm.  No murmurs/rubs/gallops Lungs: clear to auscultation bilaterally, normal respiratory effort Abdomen: soft, gravid, non-tender;  EFW: 7lbs Pelvic: deferred Extremities: non-tender, symmetric, no edema bilaterally.  DTRs: 2+ Neurologic: Alert & oriented x 3.    FHR: 125  Pertinent Results:  Prenatal Labs: Blood type/Rh O+  Antibody screen neg  Rubella Immune  Varicella Immune  RPR NR  HBsAg Neg  HIV Known carrier  GC neg  Chlamydia neg  Genetic screening negative  1 hour GTT 103  3 hour GTT   GBS positive   Cephalic by leopolds  Component     Latest Ref Rng & Units 10/11/2017  Absolute CD 4 Helper - [facility]     359 - 1519 /uL 342 (L)  % CD 4 Pos. Lymph. - [facility]     30.8 - 58.5 % 34.2  Abs. CD 8 Suppressor - [facility]     109 - 897 /uL 447  % CD 8 Pos. Lymph. - [facility]     12.0 - 35.5 % 44.7 (H)  CD4/CD8 Ratio - [facility]     0.92 - 3.72 0.77 (L)  WBC     3.4 - 10.8 x10E3/uL 4.0  RBC     3.77 - 5.28 x10E6/uL  3.65 (L)  Hemoglobin     11.1 - 15.9 g/dL 10.4 (L)  Hematocrit     34.0 - 46.6 % 30.8 (L)  MCV     79 - 97 fL 84  MCH (Mean Corpuscular Hemoglobin)     26.6 - 33.0 pg 28.5  MCHC     31.5 - 35.7 g/dL 33.8  RDW - [facility]     12.3 - 15.4 % 14.0  Platelet Count /L     150 - 379 x10E3/uL 186  Neutrophil %     Not Estab. % 58  LYMPHS -  [facility]     Not Estab. % 25  Monocyte %     Not Estab. % 12  Eosinophil %     Not Estab. % 5  Basophil%     Not Estab. % 0  Neutrophils     1.4 - 7.0 x10E3/uL 2.3  Lymphocyte Count     0.7 - 3.1 x10E3/uL 1.0  Monocyte Count     0.1 - 0.9 x10E3/uL 0.5  Eosinophils     0.0 - 0.4 x10E3/uL 0.2  Basophils     0.0 - 0.2 x10E3/uL 0.0  Immature Granulocyte %     Not Estab. % 0  Immature Granulocyte Count     0.0 - 0.1 x10E3/uL 0.0   Component     Latest Ref Rng & Units 10/30/2017   WBC     4.1 - 10.2 10^3/uL 5.2  RBC     4.04 - 5.48 10^6/uL 3.78 (L)  Hemoglobin     12.0 - 15.0 gm/dL 11.4 (L)  Hematocrit     35.0 - 47.0 % 32.9 (L)  MCV     80.0 - 100.0 fl 87.0  MCH (Mean Corpuscular Hemoglobin)     27.0 - 31.2 pg 30.2  MCHC     32.0 - 36.0 gm/dL 34.7  Platelet Count     150 - 450 10^3/uL 160  RDW-CV (Red Cell Distribution Width)     11.6 - 14.8 % 12.8  MPV     9.4 - 12.4 fl 12.4  Neutrophils     1.50 - 7.80 10^3/uL 3.40  Lymphocyte Count     1.00 - 3.60 10^3/uL 1.00  Mixed Count     0.10 - 0.90 10^3/uL 0.80  Neutrophil %     32.0 - 70.0 % 65.5  Lymphocyte %     10.0 - 50.0 % 19.9  Mixed %     3.0 - 14.4 % 14.6 (H)   Component     Latest Ref Rng & Units 10/11/2017  HIV-1 RNA by PCR - [facility]     copies/mL 300  log10 HIV-1 RNA - [facility]     log10copy/mL 2.477   RPR, HIV-1 RNA pending 10/30/17  Assessment:  Taylor Oneal is a 37 y.o. G49P1001 female at [redacted]w[redacted]d with HIV, scheduled for Cesarean for 11/01/17.   Plan:  1. Admit to Labor & Delivery 2. T&S, NPO, IVF. 3. GBS positive, ABX for surgery covers this. 4. Consents obtained. 5. Continuous efm/toco 6. HIV: has been on anti-retrovirals throughout pregnancy: Truvada + Raltregravir. Has NPV resistance. Viral copies <1000 as of 10/11/17; risk of transmission is low.  AZT to be given via IV on admission, 3 hours prior to cesarean. Then 3 drug empiric to baby after delivery.  Neonatology aware.  ----- Larey Days, MD Attending Obstetrician and Gynecologist [facility], Department of [facility]

## 2017-10-31 ENCOUNTER — Inpatient Hospital Stay: Admission: RE | Admit: 2017-10-31 | Payer: Commercial Managed Care - PPO | Source: Ambulatory Visit

## 2017-11-01 ENCOUNTER — Inpatient Hospital Stay
Admission: RE | Admit: 2017-11-01 | Discharge: 2017-11-03 | DRG: 788 | Disposition: A | Discharge status: Home / Self Care | Payer: Commercial Managed Care - PPO | Source: Ambulatory Visit | Attending: Obstetrics & Gynecology | Admitting: Obstetrics & Gynecology

## 2017-11-01 ENCOUNTER — Inpatient Hospital Stay: Payer: Commercial Managed Care - PPO | Admitting: Certified Registered Nurse Anesthetist

## 2017-11-01 ENCOUNTER — Other Ambulatory Visit: Payer: Self-pay

## 2017-11-01 ENCOUNTER — Encounter
Admission: RE | Disposition: A | Discharge status: Home / Self Care | Payer: Self-pay | Source: Ambulatory Visit | Attending: Obstetrics & Gynecology

## 2017-11-01 DIAGNOSIS — O3413 Maternal care for benign tumor of corpus uteri, third trimester: Secondary | ICD-10-CM | POA: Diagnosis present

## 2017-11-01 DIAGNOSIS — O9902 Anemia complicating childbirth: Secondary | ICD-10-CM | POA: Diagnosis present

## 2017-11-01 DIAGNOSIS — Z21 Asymptomatic human immunodeficiency virus [HIV] infection status: Secondary | ICD-10-CM | POA: Diagnosis present

## 2017-11-01 DIAGNOSIS — O9872 Human immunodeficiency virus [HIV] disease complicating childbirth: Principal | ICD-10-CM | POA: Diagnosis present

## 2017-11-01 DIAGNOSIS — O34211 Maternal care for low transverse scar from previous cesarean delivery: Secondary | ICD-10-CM | POA: Diagnosis present

## 2017-11-01 DIAGNOSIS — D649 Anemia, unspecified: Secondary | ICD-10-CM | POA: Diagnosis present

## 2017-11-01 DIAGNOSIS — O99824 Streptococcus B carrier state complicating childbirth: Secondary | ICD-10-CM | POA: Diagnosis present

## 2017-11-01 DIAGNOSIS — D259 Leiomyoma of uterus, unspecified: Secondary | ICD-10-CM | POA: Diagnosis present

## 2017-11-01 DIAGNOSIS — Z3A39 39 weeks gestation of pregnancy: Secondary | ICD-10-CM

## 2017-11-01 DIAGNOSIS — O34219 Maternal care for unspecified type scar from previous cesarean delivery: Secondary | ICD-10-CM

## 2017-11-01 LAB — CBC
HEMATOCRIT: 31.3 % — AB (ref 35.0–47.0)
HEMOGLOBIN: 10.3 g/dL — AB (ref 12.0–16.0)
MCH: 29 pg (ref 26.0–34.0)
MCHC: 32.9 g/dL (ref 32.0–36.0)
MCV: 88.4 fL (ref 80.0–100.0)
Platelets: 171 10*3/uL (ref 150–440)
RBC: 3.55 MIL/uL — AB (ref 3.80–5.20)
RDW: 13.7 % (ref 11.5–14.5)
WBC: 4.2 10*3/uL (ref 3.6–11.0)

## 2017-11-01 LAB — TYPE AND SCREEN
ABO/RH(D): O POS
Antibody Screen: NEGATIVE

## 2017-11-01 SURGERY — Surgical Case
Anesthesia: Spinal

## 2017-11-01 MED ORDER — LACTATED RINGERS IV BOLUS (SEPSIS)
1000.0000 mL | Freq: Once | INTRAVENOUS | Status: AC
  Administered 2017-11-01: 1000 mL via INTRAVENOUS

## 2017-11-01 MED ORDER — MISOPROSTOL 200 MCG PO TABS
ORAL_TABLET | ORAL | Status: AC
  Administered 2017-11-01: 11:00:00
  Filled 2017-11-01: qty 4

## 2017-11-01 MED ORDER — SENNOSIDES-DOCUSATE SODIUM 8.6-50 MG PO TABS
2.0000 | ORAL_TABLET | ORAL | Status: DC
  Administered 2017-11-02 (×2): 2 via ORAL
  Filled 2017-11-01 (×2): qty 2

## 2017-11-01 MED ORDER — LACTATED RINGERS IV SOLN
INTRAVENOUS | Status: DC
  Administered 2017-11-02: 06:00:00 via INTRAVENOUS

## 2017-11-01 MED ORDER — NALBUPHINE HCL 10 MG/ML IJ SOLN: 5.0000 mg | INTRAMUSCULAR | Status: DC | PRN

## 2017-11-01 MED ORDER — MORPHINE SULFATE (PF) 0.5 MG/ML IJ SOLN
INTRAMUSCULAR | Status: AC
  Filled 2017-11-01: qty 10

## 2017-11-01 MED ORDER — SODIUM CHLORIDE 0.9 % IV SOLN: 250.0000 mL | INTRAVENOUS | Status: DC

## 2017-11-01 MED ORDER — SODIUM CHLORIDE 0.9% FLUSH: 3.0000 mL | INTRAVENOUS | Status: DC | PRN

## 2017-11-01 MED ORDER — WITCH HAZEL-GLYCERIN EX PADS: 1.0000 "application " | MEDICATED_PAD | CUTANEOUS | Status: DC | PRN

## 2017-11-01 MED ORDER — OXYTOCIN 40 UNITS IN LACTATED RINGERS INFUSION - SIMPLE MED
INTRAVENOUS | Status: DC | PRN
  Administered 2017-11-01: 500 mL via INTRAVENOUS

## 2017-11-01 MED ORDER — AMMONIA AROMATIC IN INHA
RESPIRATORY_TRACT | Status: AC
  Filled 2017-11-01: qty 10

## 2017-11-01 MED ORDER — OXYCODONE HCL 5 MG PO TABS
5.0000 mg | ORAL_TABLET | ORAL | Status: DC | PRN
  Filled 2017-11-01 (×2): qty 1

## 2017-11-01 MED ORDER — NALBUPHINE HCL 10 MG/ML IJ SOLN: 5.0000 mg | Freq: Once | INTRAMUSCULAR | Status: DC | PRN

## 2017-11-01 MED ORDER — DIPHENHYDRAMINE HCL 25 MG PO CAPS: 25.0000 mg | ORAL_CAPSULE | Freq: Four times a day (QID) | ORAL | Status: DC | PRN

## 2017-11-01 MED ORDER — LACTATED RINGERS IV SOLN
INTRAVENOUS | Status: DC
  Administered 2017-11-01: 10:00:00 via INTRAVENOUS

## 2017-11-01 MED ORDER — MORPHINE SULFATE (PF) 0.5 MG/ML IJ SOLN
INTRAMUSCULAR | Status: DC | PRN
  Administered 2017-11-01: .1 mg via INTRATHECAL

## 2017-11-01 MED ORDER — DEXAMETHASONE SODIUM PHOSPHATE 10 MG/ML IJ SOLN
INTRAMUSCULAR | Status: DC | PRN
  Administered 2017-11-01: 8 mg via INTRAVENOUS

## 2017-11-01 MED ORDER — ONDANSETRON HCL 4 MG/2ML IJ SOLN
4.0000 mg | Freq: Three times a day (TID) | INTRAMUSCULAR | Status: DC | PRN
Start: 2017-11-01 — End: 2017-11-02
  Administered 2017-11-01: 4 mg via INTRAVENOUS
  Filled 2017-11-01: qty 2

## 2017-11-01 MED ORDER — LIDOCAINE HCL (PF) 1 % IJ SOLN
INTRAMUSCULAR | Status: AC
  Filled 2017-11-01: qty 30

## 2017-11-01 MED ORDER — OXYTOCIN 10 UNIT/ML IJ SOLN
INTRAMUSCULAR | Status: AC
  Administered 2017-11-01: 11:00:00
  Filled 2017-11-01: qty 2

## 2017-11-01 MED ORDER — NALOXONE HCL 0.4 MG/ML IJ SOLN: 0.4000 mg | INTRAMUSCULAR | Status: DC | PRN

## 2017-11-01 MED ORDER — ZIDOVUDINE 10 MG/ML IV SOLN
2.0000 mg/kg | Freq: Once | INTRAVENOUS | Status: AC
  Administered 2017-11-01: 175 mg via INTRAVENOUS
  Filled 2017-11-01: qty 17.5

## 2017-11-01 MED ORDER — SODIUM CHLORIDE 0.9% FLUSH: 3.0000 mL | Freq: Two times a day (BID) | INTRAVENOUS | Status: DC

## 2017-11-01 MED ORDER — BUPIVACAINE LIPOSOME 1.3 % IJ SUSP
20.0000 mL | Freq: Once | INTRAMUSCULAR | Status: DC
  Filled 2017-11-01: qty 20

## 2017-11-01 MED ORDER — ACETAMINOPHEN 650 MG RE SUPP
650.0000 mg | Freq: Once | RECTAL | Status: AC
  Administered 2017-11-01: 650 mg via RECTAL
  Filled 2017-11-01: qty 1

## 2017-11-01 MED ORDER — NALOXONE HCL 4 MG/10ML IJ SOLN
1.0000 ug/kg/h | INTRAVENOUS | Status: DC | PRN
  Filled 2017-11-01: qty 5

## 2017-11-01 MED ORDER — KETOROLAC TROMETHAMINE 30 MG/ML IJ SOLN
30.0000 mg | Freq: Four times a day (QID) | INTRAMUSCULAR | Status: DC | PRN
Start: 2017-11-01 — End: 2017-11-02

## 2017-11-01 MED ORDER — FENTANYL CITRATE (PF) 100 MCG/2ML IJ SOLN
INTRAMUSCULAR | Status: AC
  Filled 2017-11-01: qty 2

## 2017-11-01 MED ORDER — CEFAZOLIN SODIUM-DEXTROSE 2-4 GM/100ML-% IV SOLN
2.0000 g | INTRAVENOUS | Status: AC
  Administered 2017-11-01: 2 g via INTRAVENOUS
  Filled 2017-11-01: qty 100

## 2017-11-01 MED ORDER — COCONUT OIL OIL: 1.0000 "application " | TOPICAL_OIL | Status: DC | PRN

## 2017-11-01 MED ORDER — OXYTOCIN 40 UNITS IN LACTATED RINGERS INFUSION - SIMPLE MED
INTRAVENOUS | Status: AC
  Filled 2017-11-01: qty 1000

## 2017-11-01 MED ORDER — IBUPROFEN 600 MG PO TABS
600.0000 mg | ORAL_TABLET | Freq: Four times a day (QID) | ORAL | Status: DC
  Administered 2017-11-02 – 2017-11-03 (×6): 600 mg via ORAL
  Filled 2017-11-01 (×7): qty 1

## 2017-11-01 MED ORDER — BUPIVACAINE HCL (PF) 0.5 % IJ SOLN
30.0000 mL | Freq: Once | INTRAMUSCULAR | Status: DC
  Filled 2017-11-01: qty 30

## 2017-11-01 MED ORDER — SIMETHICONE 80 MG PO CHEW: 160.0000 mg | CHEWABLE_TABLET | Freq: Four times a day (QID) | ORAL | Status: DC | PRN

## 2017-11-01 MED ORDER — FENTANYL CITRATE (PF) 100 MCG/2ML IJ SOLN
INTRAMUSCULAR | Status: DC | PRN
  Administered 2017-11-01: 15 ug via INTRATHECAL

## 2017-11-01 MED ORDER — BUPIVACAINE IN DEXTROSE 0.75-8.25 % IT SOLN
INTRATHECAL | Status: DC | PRN
  Administered 2017-11-01: 1.6 mL via INTRATHECAL

## 2017-11-01 MED ORDER — OXYCODONE HCL 5 MG PO TABS
10.0000 mg | ORAL_TABLET | ORAL | Status: DC | PRN
  Administered 2017-11-02 – 2017-11-03 (×3): 10 mg via ORAL
  Filled 2017-11-01 (×3): qty 2

## 2017-11-01 MED ORDER — KETOROLAC TROMETHAMINE 30 MG/ML IJ SOLN
INTRAMUSCULAR | Status: DC | PRN
  Administered 2017-11-01: 30 mg via INTRAVENOUS

## 2017-11-01 MED ORDER — PHENYLEPHRINE HCL 10 MG/ML IJ SOLN
INTRAMUSCULAR | Status: DC | PRN
  Administered 2017-11-01: 350 ug via INTRAVENOUS
  Administered 2017-11-01: 100 ug via INTRAVENOUS
  Administered 2017-11-01 (×3): 200 ug via INTRAVENOUS
  Administered 2017-11-01: 100 ug via INTRAVENOUS

## 2017-11-01 MED ORDER — STERILE WATER FOR INJECTION IJ SOLN
INTRAMUSCULAR | Status: AC
  Administered 2017-11-01: 10:00:00
  Filled 2017-11-01: qty 50

## 2017-11-01 MED ORDER — SOD CITRATE-CITRIC ACID 500-334 MG/5ML PO SOLN
30.0000 mL | ORAL | Status: DC
  Filled 2017-11-01: qty 15

## 2017-11-01 MED ORDER — ONDANSETRON HCL 4 MG/2ML IJ SOLN
INTRAMUSCULAR | Status: DC | PRN
  Administered 2017-11-01: 4 mg via INTRAVENOUS

## 2017-11-01 MED ORDER — SODIUM CHLORIDE 0.9 % IV SOLN
INTRAVENOUS | Status: DC | PRN
  Administered 2017-11-01: 70 mL

## 2017-11-01 MED ORDER — MENTHOL 3 MG MT LOZG
1.0000 | LOZENGE | OROMUCOSAL | Status: DC | PRN
  Filled 2017-11-01: qty 9

## 2017-11-01 MED ORDER — ACETAMINOPHEN 500 MG PO TABS
1000.0000 mg | ORAL_TABLET | Freq: Four times a day (QID) | ORAL | Status: DC
  Administered 2017-11-01 – 2017-11-03 (×6): 1000 mg via ORAL
  Filled 2017-11-01 (×7): qty 2

## 2017-11-01 MED ORDER — BUPIVACAINE HCL (PF) 0.5 % IJ SOLN
INTRAMUSCULAR | Status: DC | PRN
  Administered 2017-11-01: 30 mL

## 2017-11-01 MED ORDER — PRENATAL MULTIVITAMIN CH
1.0000 | ORAL_TABLET | Freq: Every day | ORAL | Status: DC
  Administered 2017-11-02: 1 via ORAL
  Filled 2017-11-01: qty 1

## 2017-11-01 MED ORDER — DIBUCAINE 1 % RE OINT: 1.0000 "application " | TOPICAL_OINTMENT | RECTAL | Status: DC | PRN

## 2017-11-01 MED ORDER — OXYTOCIN 40 UNITS IN LACTATED RINGERS INFUSION - SIMPLE MED
2.5000 [IU]/h | INTRAVENOUS | Status: DC
  Administered 2017-11-01: 2.5 [IU]/h via INTRAVENOUS
  Filled 2017-11-01 (×2): qty 1000

## 2017-11-01 MED ORDER — ZIDOVUDINE 10 MG/ML IV SOLN
1.0000 mg/kg/h | INTRAVENOUS | Status: DC
  Administered 2017-11-01: 1 mg/kg/h via INTRAVENOUS
  Filled 2017-11-01: qty 40

## 2017-11-01 SURGICAL SUPPLY — 31 items
BARRIER ADHS 3X4 INTERCEED (GAUZE/BANDAGES/DRESSINGS) ×3 IMPLANT
CANISTER SUCT 3000ML PPV (MISCELLANEOUS) ×3 IMPLANT
CLOSURE WOUND 1/2 X4 (GAUZE/BANDAGES/DRESSINGS) ×1
DERMABOND ADVANCED (GAUZE/BANDAGES/DRESSINGS) ×2
DERMABOND ADVANCED .7 DNX12 (GAUZE/BANDAGES/DRESSINGS) ×1 IMPLANT
DRSG TELFA 3X8 NADH (GAUZE/BANDAGES/DRESSINGS) ×3 IMPLANT
ELECT CAUTERY BLADE 6.4 (BLADE) ×3 IMPLANT
ELECT REM PT RETURN 9FT ADLT (ELECTROSURGICAL) ×3
ELECTRODE REM PT RTRN 9FT ADLT (ELECTROSURGICAL) ×1 IMPLANT
GAUZE SPONGE 4X4 12PLY STRL (GAUZE/BANDAGES/DRESSINGS) ×3 IMPLANT
GLOVE PI ORTHOPRO 6.5 (GLOVE) ×2
GLOVE PI ORTHOPRO STRL 6.5 (GLOVE) ×1 IMPLANT
GLOVE SURG SYN 6.5 ES PF (GLOVE) ×3 IMPLANT
GOWN STRL REUS W/ TWL LRG LVL3 (GOWN DISPOSABLE) ×3 IMPLANT
GOWN STRL REUS W/TWL LRG LVL3 (GOWN DISPOSABLE) ×6
NS IRRIG 1000ML POUR BTL (IV SOLUTION) ×3 IMPLANT
PACK C SECTION AR (MISCELLANEOUS) ×3 IMPLANT
PAD OB MATERNITY 4.3X12.25 (PERSONAL CARE ITEMS) ×3 IMPLANT
PAD PREP 24X41 OB/GYN DISP (PERSONAL CARE ITEMS) ×3 IMPLANT
STRAP SAFETY 5IN WIDE (MISCELLANEOUS) ×3 IMPLANT
STRIP CLOSURE SKIN 1/2X4 (GAUZE/BANDAGES/DRESSINGS) ×2 IMPLANT
SUT MNCRL 4-0 (SUTURE) ×2
SUT MNCRL 4-0 27XMFL (SUTURE) ×1
SUT PDS AB 1 TP1 96 (SUTURE) ×3 IMPLANT
SUT VIC AB 0 CT1 36 (SUTURE) ×3 IMPLANT
SUT VIC AB 2-0 CT1 27 (SUTURE) ×2
SUT VIC AB 2-0 CT1 TAPERPNT 27 (SUTURE) ×1 IMPLANT
SUT VIC AB 3-0 SH 27 (SUTURE) ×2
SUT VIC AB 3-0 SH 27X BRD (SUTURE) ×1 IMPLANT
SUTURE MNCRL 4-0 27XMF (SUTURE) ×1 IMPLANT
SWABSTK COMLB BENZOIN TINCTURE (MISCELLANEOUS) ×3 IMPLANT

## 2017-11-01 NOTE — Clinical Social Work Note (Signed)
Of note, CSW reviewed patient's record under chart review and patient had an ED visit in July 2018 and discovered she was pregnant at that time but due to bleeding she was diagnosed with possible threatened miscarriage. Patient had her initial prenatal appointment in August 2018 and was compliant with all of the following prenatal visits. In addition, she has been compliant with all of her appointments with Dr. Ola Spurr (infectious disease) for her HIV. Shela Leff MSW,LCSW [phone]

## 2017-11-01 NOTE — Discharge Summary (Signed)
Obstetrical Discharge Summary  Patient Name: Taylor Oneal DOB: 09/15/1981 MRN: [id number]  Date of Admission: 11/01/2017 Date of Delivery: 11/01/17 Delivered by: Larey Days, MD Date of Discharge: 11/03/2017  Primary OB: [facility]  EPP:[id number] last menstrual period was 01/17/2017. EDC Estimated Date of Delivery: 11/05/17 Gestational Age at Delivery: [redacted]w[redacted]d   Antepartum complications:  1. HIV positive, with drug resistance, on 3-drug regimen with viral load 10/11/17 of 300.   2. Poor compliance 3. History or cesarean 4. History of hyperthyroid 5. Uterine fibroids 6. History of Depression 7. Anemia   Admitting Diagnosis:  Previous cesarean currently pregnant, scheduled cesarean. Secondary Diagnosis: Patient Active Problem List   Diagnosis Date Noted  . History of cesarean delivery, currently pregnant 11/01/2017  . Labor and delivery indication for care or intervention 11/01/2017  . HIV disease affecting pregnancy 06/23/2016  . Anemia affecting pregnancy 06/23/2016  . Fibroid uterus 06/23/2016  . GBS (group B Streptococcus carrier), +RV culture, currently pregnant 06/23/2016  . Asthma affecting pregnancy, antepartum 06/23/2016  . Supervision of high risk pregnancy in third trimester 06/23/2016  . Labor and delivery, indication for care 06/22/2016    Augmentation: n/a Complications: None Intrapartum complications/course: presented to L&D, received AZT IV x 3 hours prior to delivery. Date of Delivery: 11/01/17 Delivered By: [name] Delivery Type: repeat cesarean section, low transverse incision Anesthesia: spinal Placenta: sponatneous Laceration: n/a Episiotomy: n/a Newborn Data: Live born female  Birth Weight: 7 lb 12.5 oz (3530 g) APGAR: 8, 9  Newborn Delivery   Birth date/time:  11/01/2017 10:55:00 Delivery type:  C-Section, Low Transverse C-section categorization:  Repeat       Postpartum Procedures: None  Post partum course:  Patient had an  uncomplicated postpartum course.  By time of discharge on POD#2, her pain was controlled on oral pain medications; she had appropriate lochia and was ambulating, voiding without difficulty, tolerating regular diet and passing flatus.   She was deemed stable for discharge to home.    Discharge Physical Exam:  BP 106/65 (BP Location: Left Arm)   Pulse 92   Temp 97.9 F (36.6 C) (Oral)   Resp 18   Ht 5\' 4"  (1.626 m)   Wt 87.5 kg (193 lb)   LMP 01/17/2017   SpO2 99%   Breastfeeding? Unknown   BMI 33.13 kg/m   General: NAD CV: RRR Pulm: CTABL, nl effort ABD: s/nd/nt, fundus firm and below the umbilicus Lochia: moderate Incision: c/d/i, covered in surgical glue DVT Evaluation: LE non-ttp, no evidence of DVT on exam.  Hemoglobin  Date Value Ref Range Status  11/02/2017 10.4 (L) 12.0 - 16.0 g/dL Final   HCT  Date Value Ref Range Status  11/02/2017 31.1 (L) 35.0 - 47.0 % Final     Disposition: stable, discharge to home. Baby Feeding:formula Baby Disposition: home with mom  Rh Immune globulin given: n/a Rubella vaccine given: n/a Tdap vaccine given in AP or PP setting: AP Flu vaccine given in AP or PP setting: AP  Contraception: TBD  Prenatal Labs:   Blood type/Rh O+  Antibody screen neg  Rubella Immune  Varicella Immune  RPR NR  HBsAg Neg  HIV Known carrier  GC neg  Chlamydia neg  Genetic screening negative  1 hour GTT 103  3 hour GTT   GBS positive     Plan:  Taylor Oneal was discharged to home in good condition. Follow-up appointment with Dr. Leonides Schanz in 2 weeks.  Discharge Medications: Allergies as of 11/03/2017  No Known Allergies     Medication List    TAKE these medications   acetaminophen 500 MG tablet Commonly known as:  TYLENOL Take 2 tablets (1,000 mg total) by mouth every 6 (six) hours for 3 days.   docusate sodium 100 MG capsule Commonly known as:  COLACE Take 1 capsule (100 mg total) by mouth 2 (two) times daily.   ibuprofen 600 MG  tablet Commonly known as:  ADVIL,MOTRIN Take 1 tablet (600 mg total) by mouth every 6 (six) hours as needed for mild pain. What changed:  Another medication with the same name was added. Make sure you understand how and when to take each.   ibuprofen 600 MG tablet Commonly known as:  ADVIL,MOTRIN Take 1 tablet (600 mg total) by mouth every 6 (six) hours for 3 days. What changed:  You were already taking a medication with the same name, and this prescription was added. Make sure you understand how and when to take each.   oxyCODONE 5 MG immediate release tablet Commonly known as:  Oxy IR/ROXICODONE Take 1 tablet (5 mg total) by mouth every 4 (four) hours as needed for moderate pain (pain scale 4-7). What changed:  Another medication with the same name was added. Make sure you understand how and when to take each.   oxyCODONE 5 MG immediate release tablet Commonly known as:  Oxy IR/ROXICODONE Take 1 tablet (5 mg total) by mouth every 4 (four) hours as needed (pain scale 4-7). What changed:  You were already taking a medication with the same name, and this prescription was added. Make sure you understand how and when to take each.   prenatal multivitamin Tabs tablet Take 1 tablet by mouth daily at 12 noon.   raltegravir 400 MG tablet Commonly known as:  ISENTRESS Take 400 mg by mouth 2 (two) times daily.         Signed: Benjaman Kindler 11/03/17

## 2017-11-01 NOTE — Anesthesia Post-op Follow-up Note (Signed)
Anesthesia QCDR form completed.        

## 2017-11-01 NOTE — Clinical Social Work Maternal (Addendum)
  CLINICAL SOCIAL WORK MATERNAL/CHILD NOTE  Patient Details  Name: Taylor Oneal MRN: [id number] Date of Birth: [birth year]  Date:  11/01/2017  Clinical Social Worker Initiating Note:  Shela Leff MSW,LCSW Date/Time: Initiated:  11/01/17/      Child's Name:      Biological Parents:  Mother, Father   Need for Interpreter:  None   Reason for Referral:  (exact wording "HIV exposed newborn")   Address:  [address] [place] [address]    Phone number:  [phone] (home) [phone] (work)    Additional phone number: none  Household Members/Support Persons (HM/SP):       HM/SP Name Relationship DOB or Age  HM/SP -1        HM/SP -2        HM/SP -3        HM/SP -4        HM/SP -5        HM/SP -6        HM/SP -7        HM/SP -8          Natural Supports (not living in the home):  Immediate Family   Professional Supports: Other (Comment)([facility] and Infectious Disease MD)   Employment: Animator   Type of Work:     Education:  Programmer, systems   Homebound arranged:    Museum/gallery curator Resources:  Multimedia programmer   Other Resources:      Cultural/Religious Considerations Which May Impact Care:  none  Strengths:  Ability to meet basic needs , Compliance with medical plan , Home prepared for child , Understanding of illness   Psychotropic Medications:         Pediatrician:       Pediatrician List:   [place]      Pediatrician Fax Number:    Risk Factors/Current Problems:  None   Cognitive State:  Alert , Able to Concentrate    Mood/Affect:  Bright , Calm , Comfortable , Happy    CSW Assessment: CSW consulted for "HIV exposed newborn."  CSW spoke with patient and father of baby (patient gave permission to discuss topics in front of father of baby). Both live together and their one year old as well. Patient's mother has been providing support  and will be assisting with care after discharge home. Patient and father of baby have all necessities, no concerns regarding transportation, are employed and support themselves. Patient denies any history of mental illness (although documentation from MD states patient has history of depression) or drug and alcohol abuse. CSW discussed the topic of postpartum depression and patient is aware of signs and symptoms.   In reference to her HIV status, she is well educated and has been very compliant with her treatment. She is already connected with the appropriate supports: [employer] and she follows with Dr. Ola Spurr (infectious disease).   CSW signing off.  CSW Plan/Description:  No Further Intervention Required/No Barriers to Discharge    Shela Leff, LCSW 11/01/2017, 3:34 PM

## 2017-11-01 NOTE — Op Note (Signed)
Cesarean Section Procedure Note  11/01/2017   Patient:  Taylor Oneal  37 y.o. female at [redacted]w[redacted]d.  Patient's last menstrual period was 01/17/2017. Preoperative diagnosis:  Prior C-Section Postoperative diagnosis:  Prior C-Section  PROCEDURE:  Procedure(s): CESAREAN SECTION (N/A) Surgeon:  Surgeon(s) and Role:    * Jasey Cortez, Honor Loh, MD - Primary Anesthesia:  spinal I/O: Total I/O In: -  Out: 751 [Urine:50; Blood:701] EBL : 400 I can't change the incorrect entry.-cw Specimens:  Cord Blood Complications: None Apparent Disposition:  VS stable to PACU  Findings: normal uterus, tubes and ovaries bilaterally Live born female  Birth Weight: 7 lb 12.5 oz (3530 g) APGAR: 8, 9  Newborn Delivery   Birth date/time:  11/01/2017 10:55:00 Delivery type:  C-Section, Low Transverse C-section categorization:  Repeat      Indication for procedure: 37 y.o. female at [redacted]w[redacted]d with history of previous cesarean  Procedure Details   The risks, benefits, complications, treatment options, and expected outcomes were discussed with the patient. Informed consent was obtained. The patient was taken to Operating Room, identified as Taylor Oneal and the procedure verified as a cesarean delivery.   After administration of anesthesia, the patient was prepped and draped in the usual sterile manner, including a vaginal prep. A surgical time out was performed, with the pediatric team present. After confirming adequate anesthesia, a Pfannenstiel incision was made and carried down through the subcutaneous tissue to the fascia. Fascial incision was made and extended transversely. The fascia was separated from the underlying rectus tissue superiorly and inferiorly. The peritoneum was identified and entered. Peritoneal incision was extended longitudinally.  A low transverse uterine incision was made. Delivered from cephalic presentation was a live born female . Delayed cord clamping was performed for 30 seconds. The umbilical  cord was doubly clamped and cut, and the baby was handed off to the awaitng pediatrician.  Cord blood was obtained for evaluation. The placenta was removed intact and appeared normal. The uterus remained within abdominal cavity and was cleared of clots, membranes, and debris. The uterine incision was closed with running locking sutures of 0 Vicryl, and then a second, imbricating stitch was placed. Hemostasis was observed. The abdominal cavity was evacuated of extraneous fluid. The uterus had a thick band of serosa adherent to the anterior peritoneum.  This was cauterized and hemostatic.  Intercede was placed over the incision and this ligated band.  The paracolic gutters were cleaned.The fascia was then reapproximated with running suture of vicryl. 80cc of Long- and short-acting bupivicaine was injected circumferentially into the fascia.  After a change of gloves, the subcutaneous tissue was irrigated and reapproximated with 3-0 vicryl. The skin was closed with 4-0 Monocryl and 20cc of long- and short-acting bupivacaine injected into the skin and subcutaneous tissues.  The incision was covered with surgical glue.     Instrument, sponge, and needle counts were correct prior the abdominal closure and at the conclusion of the case.   I was present and performed this procedure in its entirety.  ----- Larey Days, MD Attending Obstetrician and Gynecologist [facility], Department of [facility]

## 2017-11-01 NOTE — Transfer of Care (Signed)
Immediate Anesthesia Transfer of Care Note  Patient: Taylor Oneal  Procedure(s) Performed: CESAREAN SECTION (N/A )  Patient Location: PACU  Anesthesia Type:Spinal  Level of Consciousness: awake, alert , oriented and patient cooperative  Airway & Oxygen Therapy: Patient Spontanous Breathing  Post-op Assessment: Report given to RN and Post -op Vital signs reviewed and stable  Post vital signs: Reviewed and stable  Last Vitals:  Vitals:   11/01/17 0529 11/01/17 0918  BP: 114/82 114/69  Pulse: (!) 107 83  Resp: 16   Temp: 36.6 C     Last Pain:  Vitals:   11/01/17 0730  TempSrc:   PainSc: 0-No pain         Complications: No apparent anesthesia complications

## 2017-11-01 NOTE — Interval H&P Note (Signed)
History and Physical Interval Note:  11/01/2017 7:50 AM  Taylor Oneal  has presented today for surgery, with the diagnosis of Prior C-Section  The various methods of treatment have been discussed with the patient and family. After consideration of risks, benefits and other options for treatment, the patient has consented to  Procedure(s): CESAREAN SECTION (N/A) as a surgical intervention .  The patient's history has been reviewed, patient examined, no change in status, stable for surgery.  I have reviewed the patient's chart and labs.  Questions were answered to the patient's satisfaction.    RPR non-reactive from 10/31/17 HIV RNA not yet resulted from 10/29/17, last value was 300.   AZT has been administered with infusion bolus starting at 06:40, and continuous gtt to follow.  Will not start cesarean until 3 hours from initial dosing. NICU aware.  [place]

## 2017-11-01 NOTE — Anesthesia Procedure Notes (Signed)
Spinal  Patient location during procedure: OR Start time: 11/01/2017 10:24 AM End time: 11/01/2017 10:29 AM Staffing Anesthesiologist: Martha Clan, MD Resident/CRNA: Eben Burow, CRNA Performed: resident/CRNA  Preanesthetic Checklist Completed: patient identified, surgical consent, pre-op evaluation, timeout performed, IV checked, risks and benefits discussed and monitors and equipment checked Spinal Block Patient position: sitting Prep: Betadine Patient monitoring: heart rate, continuous pulse ox and blood pressure Approach: midline Location: L3-4 Injection technique: single-shot Needle Needle type: Pencan  Needle gauge: 24 G Needle length: 10 cm Assessment Sensory level: T4

## 2017-11-01 NOTE — Anesthesia Preprocedure Evaluation (Signed)
Anesthesia Evaluation  Patient identified by MRN, date of birth, ID band Patient awake    Reviewed: Allergy & Precautions, NPO status , Patient's Chart, lab work & pertinent test results  History of Anesthesia Complications Negative for: history of anesthetic complications  Airway Mallampati: II       Dental  (+) Chipped, Dental Advidsory Given   Pulmonary neg shortness of breath, asthma , neg COPD, neg recent URI,    Pulmonary exam normal        Cardiovascular negative cardio ROS       Neuro/Psych negative neurological ROS  negative psych ROS   GI/Hepatic Neg liver ROS, GERD  ,  Endo/Other  neg diabetesHypothyroidism   Renal/GU negative Renal ROS  negative genitourinary   Musculoskeletal negative musculoskeletal ROS (+)   Abdominal Normal abdominal exam  (+)   Peds negative pediatric ROS (+)  Hematology  (+) HIV,   Anesthesia Other Findings Past Medical History: No date: Asthma     Comment:  long time ago per patient  No date: HIV (human immunodeficiency virus infection) ([place]) No date: Thyroid disease   Reproductive/Obstetrics (+) Pregnancy                             Anesthesia Physical  Anesthesia Plan  ASA: II  Anesthesia Plan: Spinal   Post-op Pain Management:    Induction:   PONV Risk Score and Plan:   Airway Management Planned: Natural Airway and Nasal Cannula  Additional Equipment:   Intra-op Plan:   Post-operative Plan:   Informed Consent: I have reviewed the patients History and Physical, chart, labs and discussed the procedure including the risks, benefits and alternatives for the proposed anesthesia with the patient or authorized representative who has indicated his/her understanding and acceptance.   Dental advisory given  Plan Discussed with: CRNA and Surgeon  Anesthesia Plan Comments:         Anesthesia Quick Evaluation

## 2017-11-02 LAB — CBC
HEMATOCRIT: 31.1 % — AB (ref 35.0–47.0)
Hemoglobin: 10.4 g/dL — ABNORMAL LOW (ref 12.0–16.0)
MCH: 29.6 pg (ref 26.0–34.0)
MCHC: 33.4 g/dL (ref 32.0–36.0)
MCV: 88.6 fL (ref 80.0–100.0)
Platelets: 161 10*3/uL (ref 150–440)
RBC: 3.51 MIL/uL — ABNORMAL LOW (ref 3.80–5.20)
RDW: 13.8 % (ref 11.5–14.5)
WBC: 8.7 10*3/uL (ref 3.6–11.0)

## 2017-11-02 LAB — RPR: RPR: NONREACTIVE

## 2017-11-02 MED ORDER — RALTEGRAVIR POTASSIUM 400 MG PO TABS: 400.0000 mg | ORAL_TABLET | Freq: Two times a day (BID) | ORAL | Status: DC

## 2017-11-02 NOTE — Progress Notes (Signed)
Subjective: Postpartum Day 1: Cesarean Delivery Patient reports incisional pain.    Objective: Vital signs in last 24 hours: Temp:  [98 F (36.7 C)-98.4 F (36.9 C)] 98 F (36.7 C) (02/02 0730) Pulse Rate:  [61-82] 82 (02/02 0730) Resp:  [12-18] 17 (02/02 0730) BP: (92-121)/(55-86) 113/55 (02/02 0730) SpO2:  [99 %-100 %] 100 % (02/02 0730)  Physical Exam:  General: alert, cooperative and appears stated age 37: appropriate Uterine Fundus: firm Incision: healing well, no significant drainage, no dehiscence, no significant erythema DVT Evaluation: No evidence of DVT seen on physical exam.  Recent Labs    11/01/17 0843 11/02/17 0609  HGB 10.3* 10.4*  HCT 31.3* 31.1*    Assessment/Plan: Status post Cesarean section. Doing well postoperatively.  Continue current care. Undecided contraception Bottle feeding  Taylor Oneal 11/02/2017, 12:42 PM

## 2017-11-02 NOTE — Anesthesia Postprocedure Evaluation (Signed)
Anesthesia Post Note  Patient: Taylor Oneal  Procedure(s) Performed: CESAREAN SECTION (N/A )  Patient location during evaluation: Mother Baby Anesthesia Type: Spinal Level of consciousness: oriented and awake and alert Pain management: pain level controlled Vital Signs Assessment: post-procedure vital signs reviewed and stable Respiratory status: spontaneous breathing, respiratory function stable and patient connected to nasal cannula oxygen Cardiovascular status: blood pressure returned to baseline and stable Postop Assessment: no headache, no backache and no apparent nausea or vomiting Anesthetic complications: no     Last Vitals:  Vitals:   11/02/17 0300 11/02/17 0730  BP: 111/64 (!) 113/55  Pulse: 80 82  Resp: 18 17  Temp: 36.9 C 36.7 C  SpO2: 100% 100%    Last Pain:  Vitals:   11/02/17 0730  TempSrc: Oral  PainSc: 0-No pain                 Martha Clan

## 2017-11-02 NOTE — Plan of Care (Signed)
Patient's vital signs stable; fundus firm; scant rubra lochia; patient ambulates to bathroom independently; self peri care; good po fluids; good appetite; good maternal-infant bonding observed; pain controlled with po motrin and po tylenol; nurse instructed and patient v/u of s/s incision infection; FOB at bedside and attentive; patient teary earlier and talked about her 36yo brother and his 23yo girlfriend being killed in a car accident in June 2015; patient talked about her and her mother's journey of grieving over her brother's death; patient is very happy about her 72 yo son and this newborn son. Nurse used active listening and encouraged patient.

## 2017-11-02 NOTE — Anesthesia Post-op Follow-up Note (Signed)
  Anesthesia Pain Follow-up Note  Patient: Taylor Oneal  Day #: 1  Date of Follow-up: 11/02/2017 Time: 10:03 AM  Last Vitals:  Vitals:   11/02/17 0300 11/02/17 0730  BP: 111/64 (!) 113/55  Pulse: 80 82  Resp: 18 17  Temp: 36.9 C 36.7 C  SpO2: 100% 100%    Level of Consciousness: alert  Pain: mild   Side Effects:None  Catheter Site Exam:clean, dry     Plan: D/C from anesthesia care at surgeon's request  Martha Clan

## 2017-11-03 MED ORDER — OXYCODONE HCL 5 MG PO TABS: 5.0000 mg | ORAL_TABLET | ORAL | 0 refills | Status: DC | PRN

## 2017-11-03 MED ORDER — IBUPROFEN 600 MG PO TABS: 600.0000 mg | ORAL_TABLET | Freq: Four times a day (QID) | ORAL | 0 refills | Status: AC

## 2017-11-03 MED ORDER — ACETAMINOPHEN 500 MG PO TABS: 1000.0000 mg | ORAL_TABLET | Freq: Four times a day (QID) | ORAL | 0 refills | Status: AC

## 2017-11-03 NOTE — Discharge Instructions (Signed)

## 2017-11-03 NOTE — Progress Notes (Signed)
Pt discharged with infant.  Discharge instructions, prescriptions and follow up appointment given to and reviewed with pt. Pt verbalized understanding. Escorted out by auxillary. 

## 2019-02-03 ENCOUNTER — Encounter: Payer: Self-pay | Admitting: Infectious Diseases

## 2019-02-03 ENCOUNTER — Ambulatory Visit: Payer: Medicaid Other | Attending: Infectious Diseases | Admitting: Infectious Diseases

## 2019-02-03 ENCOUNTER — Other Ambulatory Visit: Payer: Self-pay

## 2019-02-03 VITALS — BP 118/76 | HR 88 | Temp 98.3°F | Ht 64.0 in | Wt 191.0 lb

## 2019-02-03 DIAGNOSIS — D259 Leiomyoma of uterus, unspecified: Secondary | ICD-10-CM

## 2019-02-03 DIAGNOSIS — B20 Human immunodeficiency virus [HIV] disease: Secondary | ICD-10-CM

## 2019-02-03 DIAGNOSIS — Z79899 Other long term (current) drug therapy: Secondary | ICD-10-CM

## 2019-02-03 DIAGNOSIS — F329 Major depressive disorder, single episode, unspecified: Secondary | ICD-10-CM

## 2019-02-03 DIAGNOSIS — Z21 Asymptomatic human immunodeficiency virus [HIV] infection status: Secondary | ICD-10-CM

## 2019-02-03 DIAGNOSIS — J45909 Unspecified asthma, uncomplicated: Secondary | ICD-10-CM

## 2019-02-03 MED ORDER — RALTEGRAVIR POTASSIUM 400 MG PO TABS: 400.0000 mg | ORAL_TABLET | Freq: Two times a day (BID) | ORAL | 1 refills | Status: DC

## 2019-02-03 MED ORDER — EMTRICITABINE-TENOFOVIR DF 200-300 MG PO TABS: 1.0000 | ORAL_TABLET | Freq: Every day | ORAL | 1 refills | Status: AC

## 2019-02-03 NOTE — Progress Notes (Signed)
NAME: Taylor Oneal  DOB: 09/30/1981  MRN: [id number]  Date/Time: 02/03/2019 9:41 AM   Subjective:  REASON FOR CONSULT: here to engage in HIV care ? Taylor Oneal is a 38 y.o.female  with a history of HIV, Depression, Asthma, Fibroid uterus Was diagnose din 2008 and was on treatment until June 2019. Since then has been isentress only June 2019 Was a patient of Dr.Fitzgerald She currently has no insurance- ( says medicaid family which she has after child birth covers only for  HIV diagnosed 2008 Nadir Cd4 : NK CD4 ; 405 (45%) VL < 20 on 02/11/18 NT:IRWE HAARt history Atripla 2008-2014 Isentress/Truvada 12/28/2015 Acquired thru heterosexual  Genotype: 11/2015 [id number] ? Past Medical History:  Diagnosis Date  . Asthma    long time ago per patient   . HIV (human immunodeficiency virus infection) ([place])   . Thyroid disease    Depression  Past Surgical History:  Procedure Laterality Date  . CESAREAN SECTION N/A 06/23/2016   Procedure: CESAREAN SECTION;  Surgeon: Honor Loh Ward, MD;  Location: [facility] ORS;  Service: Obstetrics;  Laterality: N/A;  . 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 Her partner knows her status   Family History  Problem Relation Age of Onset  . Hypertension Mother   . Diabetes Father   . Hypertension Father   . Hypertension Maternal Grandfather    No Known Allergies ? Current Outpatient Medications  Medication Sig Dispense Refill  . raltegravir (ISENTRESS) 400 MG tablet Take 400 mg by mouth 2 (two) times daily.    Marland Kitchen docusate sodium (COLACE) 100 MG capsule Take 1 capsule (100 mg total) by mouth 2 (two) times daily. (Patient not taking: Reported on 02/03/2019) 30 capsule 0  .  ibuprofen (ADVIL,MOTRIN) 600 MG tablet Take 1 tablet (600 mg total) by mouth every 6 (six) hours as needed for mild pain. (Patient not taking: Reported on 11/01/2017) 30 tablet 0  . oxyCODONE (OXY IR/ROXICODONE) 5 MG immediate release tablet Take 1 tablet (5 mg total) by mouth every 4 (four) hours as needed for moderate pain (pain scale 4-7). (Patient not taking: Reported on 02/03/2019) 30 tablet 0  . oxyCODONE (OXY IR/ROXICODONE) 5 MG immediate release tablet Take 1 tablet (5 mg total) by mouth every 4 (four) hours as needed (pain scale 4-7). (Patient not taking: Reported on 02/03/2019) 20 tablet 0  . Prenatal Vit-Fe Fumarate-FA (PRENATAL MULTIVITAMIN) TABS tablet Take 1 tablet by mouth daily at 12 noon.     No current facility-administered medications for this visit.     REVIEW OF SYSTEMS:  Const: negative fever, negative chills, negative weight loss Eyes: negative diplopia or visual changes, negative eye pain ENT: negative coryza, negative sore throat Resp: negative cough, hemoptysis, dyspnea Cards: negative for chest pain, palpitations, lower extremity edema GU: negative for frequency, dysuria and hematuria Skin: negative for rash and pruritus Heme: negative for easy bruising and gum/nose bleeding MS: negative for myalgias, arthralgias, back pain and muscle weakness Neurolo:negative for headaches, dizziness, vertigo, memory problems  Psych:  depression   Objective:  VITALS:  BP 118/76 (BP Location: Left Arm, Patient Position: Sitting, Cuff Size: Normal)   Pulse 88   Temp 98.3 F (  36.8 C) (Oral)   Ht 5\' 4"  (1.626 m)   Wt 191 lb (86.6 kg)   LMP 01/30/2019 (Exact Date)   BMI 32.79 kg/m  PHYSICAL EXAM:  General: Alert, cooperative, no distress, appears stated age.  Head: Normocephalic, without obvious abnormality, atraumatic. Eyes: Conjunctivae clear, anicteric sclerae. Pupils are equal Nose: Nares normal. No drainage or sinus tenderness. Throat: Lips, mucosa, and tongue normal. No  Thrush Neck: Supple, symmetrical, no adenopathy, thyroid: non tender no carotid bruit and no JVD. Back: No CVA tenderness. Lungs: Clear to auscultation bilaterally. No Wheezing or Rhonchi. No rales. Heart: Regular rate and rhythm, no murmur, rub or gallop. Abdomen: Soft, non-tender,not distended. Bowel sounds normal. No masses Extremities: Extremities normal, atraumatic, no cyanosis. No edema. No clubbing Skin: No rashes or lesions. Not Jaundiced Lymph: Cervical, supraclavicular normal. Neurologic: Grossly non-focal Pertinent Labs {none IMAGING RESULTS: Health maintenance Vaccination  Vaccine Date last given comment  Influenza    Hepatitis B    Hepatitis A    Prevnar-PCV-13    Pneumovac-PPSV-23    TdaP    HPV    Shingrix ( zoster vaccine)     ______________________  Labs Lab Result  Date comment  HIV VL     CD4     Genotype     [id number]     HIV antibody     RPR     Quantiferon Gold     Hep C ab     Hepatitis B-ab,ag,c     Hepatitis A-IgM, IgG /T     Lipid     GC/CHL     PAP            Preventive  Procedure Result  Date comment  colonoscopy     Mammogram     Dental exam     Opthal       Impression/Recommendation ? ?38 yr female with HIV here to engage in care  She used to be in truvada and isentress and was followed by Dr.Fitzgerald in 2019. She deliverd Nov 01 2017 and her medicaid that she had during pregnancy is now medicaid family that has no coverage for meds or doc visits or labs.  she has been taking only isentress for the past year and [facility] will not cover her meds Truvada and isentress which I sent-  Very likely she has devloped resistance to isentress - she will need labs including genosure archive  I called [facility] and made an appt for her to be enrolled in care- they have scheduled a Tele Visit on thursday ? Informed the patient about the visit _Discussed with patient in detail

## 2019-02-03 NOTE — Patient Instructions (Signed)
You are here to engage in care- As you dont have insurance that would cover medications and labs we are referring you to [place] have an appt ( tele/Phone) appt on Thursday at 10.30Am

## 2019-03-23 ENCOUNTER — Other Ambulatory Visit: Payer: Self-pay | Admitting: Registered Nurse

## 2019-03-23 DIAGNOSIS — B20 Human immunodeficiency virus [HIV] disease: Secondary | ICD-10-CM

## 2019-03-23 DIAGNOSIS — E079 Disorder of thyroid, unspecified: Secondary | ICD-10-CM

## 2019-03-23 DIAGNOSIS — Z3481 Encounter for supervision of other normal pregnancy, first trimester: Secondary | ICD-10-CM

## 2019-03-23 DIAGNOSIS — F418 Other specified anxiety disorders: Secondary | ICD-10-CM

## 2019-03-30 ENCOUNTER — Other Ambulatory Visit: Payer: Self-pay | Admitting: Obstetrics and Gynecology

## 2019-03-30 ENCOUNTER — Other Ambulatory Visit: Payer: Self-pay

## 2019-03-30 ENCOUNTER — Ambulatory Visit
Admission: RE | Admit: 2019-03-30 | Discharge: 2019-03-30 | Disposition: A | Discharge status: Home / Self Care | Payer: Medicaid Other | Source: Ambulatory Visit | Attending: Obstetrics and Gynecology | Admitting: Obstetrics and Gynecology

## 2019-03-30 ENCOUNTER — Ambulatory Visit
Admission: RE | Admit: 2019-03-30 | Discharge: 2019-03-30 | Disposition: A | Discharge status: Home / Self Care | Payer: Medicaid Other | Source: Ambulatory Visit | Attending: Registered Nurse | Admitting: Registered Nurse

## 2019-03-30 DIAGNOSIS — O99511 Diseases of the respiratory system complicating pregnancy, first trimester: Secondary | ICD-10-CM | POA: Diagnosis not present

## 2019-03-30 DIAGNOSIS — F418 Other specified anxiety disorders: Secondary | ICD-10-CM

## 2019-03-30 DIAGNOSIS — O98711 Human immunodeficiency virus [HIV] disease complicating pregnancy, first trimester: Secondary | ICD-10-CM | POA: Diagnosis not present

## 2019-03-30 DIAGNOSIS — B20 Human immunodeficiency virus [HIV] disease: Secondary | ICD-10-CM

## 2019-03-30 DIAGNOSIS — O98719 Human immunodeficiency virus [HIV] disease complicating pregnancy, unspecified trimester: Secondary | ICD-10-CM | POA: Diagnosis present

## 2019-03-30 DIAGNOSIS — O30039 Twin pregnancy, monochorionic/diamniotic, unspecified trimester: Secondary | ICD-10-CM | POA: Diagnosis present

## 2019-03-30 DIAGNOSIS — O30031 Twin pregnancy, monochorionic/diamniotic, first trimester: Secondary | ICD-10-CM

## 2019-03-30 DIAGNOSIS — Z3009 Encounter for other general counseling and advice on contraception: Secondary | ICD-10-CM | POA: Diagnosis present

## 2019-03-30 DIAGNOSIS — O99341 Other mental disorders complicating pregnancy, first trimester: Secondary | ICD-10-CM | POA: Diagnosis not present

## 2019-03-30 DIAGNOSIS — L732 Hidradenitis suppurativa: Secondary | ICD-10-CM

## 2019-03-30 DIAGNOSIS — O09529 Supervision of elderly multigravida, unspecified trimester: Secondary | ICD-10-CM

## 2019-03-30 DIAGNOSIS — E079 Disorder of thyroid, unspecified: Secondary | ICD-10-CM

## 2019-03-30 DIAGNOSIS — Z3481 Encounter for supervision of other normal pregnancy, first trimester: Secondary | ICD-10-CM

## 2019-03-30 DIAGNOSIS — D252 Subserosal leiomyoma of uterus: Secondary | ICD-10-CM

## 2019-03-30 DIAGNOSIS — F329 Major depressive disorder, single episode, unspecified: Secondary | ICD-10-CM | POA: Insufficient documentation

## 2019-03-30 DIAGNOSIS — O99210 Obesity complicating pregnancy, unspecified trimester: Secondary | ICD-10-CM | POA: Insufficient documentation

## 2019-03-30 DIAGNOSIS — O34219 Maternal care for unspecified type scar from previous cesarean delivery: Secondary | ICD-10-CM

## 2019-03-30 DIAGNOSIS — O3411 Maternal care for benign tumor of corpus uteri, first trimester: Secondary | ICD-10-CM | POA: Diagnosis not present

## 2019-03-30 DIAGNOSIS — D259 Leiomyoma of uterus, unspecified: Secondary | ICD-10-CM | POA: Insufficient documentation

## 2019-03-30 DIAGNOSIS — Z3A08 8 weeks gestation of pregnancy: Secondary | ICD-10-CM | POA: Insufficient documentation

## 2019-03-30 DIAGNOSIS — Z21 Asymptomatic human immunodeficiency virus [HIV] infection status: Secondary | ICD-10-CM | POA: Insufficient documentation

## 2019-03-30 DIAGNOSIS — F32A Depression, unspecified: Secondary | ICD-10-CM | POA: Insufficient documentation

## 2019-03-30 DIAGNOSIS — O99211 Obesity complicating pregnancy, first trimester: Secondary | ICD-10-CM

## 2019-03-30 DIAGNOSIS — O99019 Anemia complicating pregnancy, unspecified trimester: Secondary | ICD-10-CM | POA: Diagnosis present

## 2019-03-30 DIAGNOSIS — O09891 Supervision of other high risk pregnancies, first trimester: Secondary | ICD-10-CM

## 2019-03-30 DIAGNOSIS — D251 Intramural leiomyoma of uterus: Secondary | ICD-10-CM

## 2019-03-30 DIAGNOSIS — J45909 Unspecified asthma, uncomplicated: Secondary | ICD-10-CM

## 2019-03-30 DIAGNOSIS — L309 Dermatitis, unspecified: Secondary | ICD-10-CM

## 2019-03-30 DIAGNOSIS — O210 Mild hyperemesis gravidarum: Secondary | ICD-10-CM

## 2019-03-30 DIAGNOSIS — O09521 Supervision of elderly multigravida, first trimester: Secondary | ICD-10-CM | POA: Diagnosis present

## 2019-03-30 DIAGNOSIS — Z79899 Other long term (current) drug therapy: Secondary | ICD-10-CM | POA: Diagnosis not present

## 2019-03-30 HISTORY — DX: Anemia, unspecified: D64.9

## 2019-03-30 NOTE — Progress Notes (Addendum)
Taylor Oneal   Chief Complaint: referred due to HIV infection in pregnancy , mono Di twins identified today   HPI: Taylor Oneal is a 38 y.o. 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  The patient reports this is an unplanned pregnancy and she is somewhat overwhelmed by having 2 children under the age of 75.  But she does not believe in abortion and  feels her pregnancy is a gift.  She is interested in a tubal ligation at the time of delivery.  Taylor. Taylor Oneal HIV infection was diagnosed in 2008 when she developed a community-acquired pneumonia.  She is thought to have acquired HIV heterosexually.  Her partner,  the father of her 2 children and this pregnancy, is HIV negative.  She was maintained on Atripla for a number of years until her resistance testing suggested she was resistant.  At that time she was switched to Truvada /Isentress in her second pregnancy and delivered May 2019.  Last fall she ran out of medication.  She took the Isentress alone for month(s) .  She recently  saw Alwyn Pea NP at [facility].  Her labs were obtained.  She was found to be somewhat neutropenic with a white count of 2.9.  Her CD4 count was 230.  A viral load was not reported.  Neither was a genosure.  The patient was [redacted] weeks pregnant at the time.  Her prenatal labs were obtained.   Taylor. Taylor Oneal changed her medications to Truvada Prezista (darunavir) ritonavir (Norvir).   The patient has requested privacy around her diagnosis.  Her partner knows she is HIV positive.   She chose to have her last baby at [facility] for her privacy.  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  Both her children [place] and [place] are HIV negative,  they were followed at [facility] pediatric infectious disease.   She had Zoster  about a year ago and has scarring on her right upper arm .   The patient has undergone cesarean sections x2 and desires a third c/s with her tubal ligation.  Today on ultrasound she was found to have mono di-twins at 8 weeks 3 days.  The patient does note that her morning sickness has been much worse in this pregnancy.  She was given a prescription for her nausea by Taylor. [name]. The patient initially thought it was the HIV medication switch but is willing to work harder to get the meds down now that she knows it might be the twin pregnancy .  The patient is over 35 at this point and is interested in testing for Down syndrome.  She will be willing to speak to the genetic counselor.  The patient has a history of depression the states from 2015 when her brother was killed in a motor vehicle accident.  The patient started Zoloft 25 which she says is helping.  The patient reports that she has never been on medication for her thyroid but was told it was mildly abnormal.  Recent labs appear normal.       Past Medical History: Patient  has a past medical history of Anemia, Asthma, HIV (human immunodeficiency virus infection) ([place]), and Thyroid disease.  Past Surgical History: She  has a past surgical history that includes Cesarean section (N/A, 06/23/2016) and Cesarean  section (N/A, 11/01/2017).  Obstetric History:  OB History    Gravida  3   Para  2   Term  2   Preterm      AB      Living  2     SAB      TAB      Ectopic      Multiple  0   Live Births  1          Gynecologic History:  Patient's last menstrual period was 01/30/2019 (exact date).  Pap 2017 negative   Medications:  Current Outpatient Medications:  .  clotrimazole (LOTRIMIN) 1 % cream, Apply 1 application topically 2 (two) times daily. 15GM, Disp: , Rfl:  .  darunavir (PREZISTA) 800 MG tablet, Take 800 mg by mouth 2 (two) times a day., Disp: , Rfl:  .  emtricitabine-tenofovir (TRUVADA) 200-300 MG tablet, Take  1 tablet by mouth daily., Disp: 30 tablet, Rfl: 1 .  Prenatal Vit-Fe Fumarate-FA (PRENATAL MULTIVITAMIN) TABS tablet, Take 1 tablet by mouth daily at 12 noon., Disp: , Rfl:  .  ritonavir (NORVIR) 100 MG TABS tablet, Take by mouth daily with breakfast., Disp: , Rfl:  .  sertraline (ZOLOFT) 25 MG tablet, Take 25 mg by mouth daily., Disp: , Rfl:   Reports "nausea meds"  Allergies: Patient has No Known Allergies.  Social History: Patient  reports that she has never smoked. She has never used smokeless tobacco. She reports that she does not drink alcohol or use drugs.  Family History: family history includes Depression in her mother; Diabetes in her father; Hypertension in her father, maternal grandfather, and mother; Thyroid disease in her mother.  Review of Systems itchy patch on right antecubital fossa , boils in her arm pit   Physical Exam: LMP 01/30/2019 (Exact Date)  Height 64 inches Weight 178.5 Temp 98.2 BP 113/72 See ultrasound mono di-twins at 8 weeks 3 days  Asessement: 1. HIV disease affecting pregnancy in first trimester   2. Monochorionic diamniotic twin gestation in first trimester   3. Elderly multigravida with antepartum condition or complication   4. Hyperemesis affecting pregnancy, antepartum   5. Asthma affecting pregnancy, antepartum   6. Axillary hidradenitis suppurativa   7. Eczema of right upper extremity versus tinea    8. Thyroid disorder   9. Previous cesarean delivery, antepartum condition or complication   10. Short interval between pregnancies affecting pregnancy in first trimester, antepartum   7. pt desires BTL with cesarean    12. Intramural and subserous leiomyoma of uterus   13. Reactive depression     Plan: 1- monochorionic twins -The patient had not planned this pregnancy and I offered the option of termination of pregnancy if she felt overwhelmed by the diagnosis of twins.  Patient stated she wanted to continue.  I reviewed  the risks of mono  di-twinning,  including high risks of anomalies, preterm birth, and  twin to twin transfusion syndrome.   I Recommended she return in 4 weeks for another ultrasound at around 12w 3d .  start every 2 week T TTS checks at 16 weeks   2 HIV infection I ordered a viral load and Genosure today as I could not find them included on the labs at [facility].  I agree with her provider there , that given she took Isentress as a single drug fo rat least a  month she may have resistance.  I recommended that the patient try to stick  with her Truvada Prezista Norvir.  If she continues to have stomach upset or cannot tolerate it that we can switch back to an integrase inhibitor- dolutegravir might be a reasonable option.  Patient gave me permission to share her pregnancy information with the [facility].  3 depression continue Zoloft 4 desires sterilization sign paperwork at 24 weeks  Her thyroid and asthma issues are stable Fibroids were small today   Total time spent with the patient was 30 minutes with greater than 50% spent in counseling and coordination of care. We appreciate this interesting consult and will be happy to be involved in the ongoing care of Taylor. Vanloan in anyway her obstetricians desire.  Kenia Teagarden, [facility]

## 2019-04-01 LAB — HIV-1 RNA QUANT-NO REFLEX-BLD
HIV 1 RNA Quant: 1870 copies/mL
LOG10 HIV-1 RNA: 3.272 log10copy/mL

## 2019-04-06 NOTE — Addendum Note (Signed)
Encounter addended by: Gatha Mayer, MD on: 04/06/2019 11:47 AM  Actions taken: Clinical Note Signed

## 2019-04-08 ENCOUNTER — Ambulatory Visit: Payer: Self-pay | Admitting: Family Medicine

## 2019-04-20 ENCOUNTER — Ambulatory Visit
Admission: RE | Admit: 2019-04-20 | Discharge: 2019-04-20 | Disposition: A | Discharge status: Home / Self Care | Payer: Medicaid Other | Source: Ambulatory Visit | Attending: Maternal & Fetal Medicine | Admitting: Maternal & Fetal Medicine

## 2019-04-20 DIAGNOSIS — O09521 Supervision of elderly multigravida, first trimester: Secondary | ICD-10-CM

## 2019-04-20 NOTE — Progress Notes (Signed)
Virtual Visit via Telephone Note  I connected with Taylor Oneal on April 20, 2019 at 10:00 AM EDT by telephone and verified that I am speaking with the correct person using two identifiers.  Referring Provider:  Medicine, [place] Length of Consultation: 35 minutes  Taylor Oneal was referred to [place] [facility] for genetic counseling because of advanced maternal age.  The patient will be 38 years old at the time of delivery.  This note summarizes the information we discussed.    We explained that the chance of a chromosome abnormality increases with maternal age.  Chromosomes and examples of chromosome problems were reviewed.  Humans typically have 46 chromosomes in each cell, with half passed through each sperm and egg.  Any change in the number or structure of chromosomes can increase the risk of problems in the physical and mental development of a pregnancy.   Based upon age of the patient, the chance of any chromosome abnormality was 1 in 76. The chance of Down syndrome, the most common chromosome problem associated with maternal age, was 1 in 14.  The risk of chromosome problems is in addition to the 3% general population risk for birth defects and intellectual disabilities. This is a twin gestation.  If the twins are monozygotic, then we expect the chromosome constitution to be the same for both. However, if they are dizygotic, then this risk would be for each baby. The greatest chance, of course, is that both babies would be born in good health.  We discussed the following prenatal screening and testing options for this pregnancy:  We reviewed the availability of cell free fetal DNA testing from maternal blood to determine whether or not the baby may have either Down syndrome, trisomy 36, or trisomy 14.  This test utilizes a maternal blood sample and DNA sequencing technology to isolate circulating cell free fetal DNA from maternal plasma.  The fetal DNA can then  be analyzed for DNA sequences that are derived from the three most common chromosomes involved in aneuploidy, chromosomes 13, 18, and 21.  If the overall amount of DNA is greater than the expected level for any of these chromosomes, aneuploidy is suspected.  While we do not consider it a replacement for invasive testing and karyotype analysis, a negative result from this testing would be reassuring, though not a guarantee of a normal chromosome complement for the baby.  An abnormal result is certainly suggestive of an abnormal chromosome complement, though we would still recommend CVS or amniocentesis to confirm any findings from this testing.  First trimester screening, which includes nuchal translucency ultrasound screen and first trimester maternal serum marker screening.  The nuchal translucency has approximately an 80% detection rate for Down syndrome and can be positive for other chromosome abnormalities as well as heart defects.  When combined with a maternal serum marker screening, the detection rate is up to 90% for Down syndrome and up to 97% for trisomy 18.   Due to COVID restrictions and the decreased detection rate of chromosome conditions with first trimester screening, we would instead offer cell free fetal DNA testing (see above).  The chorionic villus sampling procedure is available for first trimester chromosome analysis.  This involves the withdrawal of a small amount of chorionic villi (tissue from the developing placenta).  Risk of pregnancy loss is estimated to be approximately 1 in 200 to 1 in 100 (0.5 to 1%).  There is approximately a 1% (1 in 100) chance that the  CVS chromosome results will be unclear.  Chorionic villi cannot be tested for neural tube defects.     Maternal serum marker screening, a blood test that measures pregnancy proteins, can provide risk assessments for Down syndrome, trisomy 18, and open neural tube defects (spina bifida, anencephaly). Because it does not  directly examine the fetus, it cannot positively diagnose or rule out these problems.  Targeted ultrasound uses high frequency sound waves to create an image of the developing fetus.  An ultrasound is often recommended as a routine means of evaluating the pregnancy.  It is also used to screen for fetal anatomy problems (for example, a heart defect) that might be suggestive of a chromosomal or other abnormality.   Amniocentesis involves the removal of a small amount of amniotic fluid from the sac surrounding the fetus with the use of a thin needle inserted through the maternal abdomen and uterus.  Ultrasound guidance is used throughout the procedure.  Fetal cells from amniotic fluid are directly evaluated and > 99.5% of chromosome problems and > 98% of open neural tube defects can be detected. This procedure is generally performed after the 15th week of pregnancy.  The main risks to this procedure include complications leading to miscarriage in less than 1 in 200 cases (0.5%).  Cystic Fibrosis and Spinal Muscular Atrophy (SMA) screening were also discussed with the patient. Both conditions are recessive, which means that both parents must be carriers in order to have a child with the disease.  Cystic fibrosis (CF) is one of the most common genetic conditions in persons of Caucasian ancestry.  This condition occurs in approximately 1 in 2,500 Caucasian persons and results in thickened secretions in the lungs, digestive, and reproductive systems.  For a baby to be at risk for having CF, both of the parents must be carriers for this condition.  Approximately 1 in 80 Caucasian persons is a carrier for CF.  Current carrier testing looks for the most common mutations in the gene for CF and can detect approximately 90% of carriers in the Caucasian population.  This means that the carrier screening can greatly reduce, but cannot eliminate, the chance for an individual to have a child with CF.  If an individual is found  to be a carrier for CF, then carrier testing would be available for the partner. As part of [place] newborn screening profile, all babies born in the state of [place] will have a two-tier screening process.  Specimens are first tested to determine the concentration of immunoreactive trypsinogen (IRT).  The top 5% of specimens with the highest IRT values then undergo DNA testing using a panel of over 40 common CF mutations. SMA is a neurodegenerative disorder that leads to atrophy of skeletal muscle and overall weakness.  This condition is also more prevalent in the Caucasian population, with 1 in 40-1 in 60 persons being a carrier and 1 in 6,000-1 in 10,000 children being affected.  There are multiple forms of the disease, with some causing death in infancy to other forms with survival into adulthood.  The genetics of SMA is complex, but carrier screening can detect up to 95% of carriers in the Caucasian population.  Similar to CF, a negative result can greatly reduce, but cannot eliminate, the chance to have a child with SMA. Carrier testing for hemoglobinopathies is also available for Taylor Oneal if it has not been performed previously.  We obtained a detailed family history and pregnancy history.  The patient reported  one sister who has a son with autism.  We explained that this condition may be inherited in a mutifactorial manner, or due to a combination of genetic factors and environmental factors, or can be present as a characteristic of an underlying genetic condition that follows a traditional Mendelian inheritance pattern.  A proposed mechanism for autism is that an affected individual inherits changes in one or more genes associated with susceptibility for autism, and then undergoes some exposure or change during development that causes development of the symptoms of autism.  The suspected underlying genetic changes that predispose an individual to autism are complex, and not completely  understood at this time.  Taylor Oneal thought that testing had been performed, but is not aware of the results or any underlying cause for his condition.  If more is learned about the testing results, we are happy to review those records.  We also discussed the option of screening for Fragile X syndrome, which is one of the most common causes of inherited intellectual disabilities in males, and may present with symptoms of autism.  Fragile X syndrome is caused by a change, or expansion, of the DNA in the gene FMR1.  Carrier screening is used to determine the number of repeats in this region of the gene and to determine the likelihood of having a child with this condition.  A negative result would mean that the individual undergoing carrier testing is not at risk for having a child with Fragile X, but cannot exclude other causes for developmental differences.  In some situations, results from this testing may be inconclusive. Taylor Oneal stated that she was not concerned about pursuing testing at this time. Also in the family history, she reported that her father has dementia which began in his mid-97s.  There are no other family members with dementia, and we reviewed that the possible genetic factors with this condition are not well understood.  Our call was disconnected prior to completion of the family history for her partner, Taylor Oneal. No concerns were reported prior to the call ending and being unable to reconnect.  If there are concerns, please let us know and we can review those at a later time. The remainder of the family history is unremarkable for birth defects, developmental delays, recurrent pregnancy loss or known chromosome abnormalities.  Taylor Oneal stated that this is the third pregnancy for she and Taylor Oneal.  She reported they have two healthy sons, ages 70 and 1 years. The current pregnancy was found to have monochorionic-diamniotic twins at her first ultrasound at [facility] on 03/30/2019  at 8 weeks, 3 days. The patient has a known history of HIV and multiple medications, which she has already reviewed in detail with Dr. Gatha Mayer.  Please see her MFM consultation note.  After consideration of the options, Taylor Oneal elected to proceed with MaterniT21 PLUS to be drawn at her next visit to [facility] on April 27, 2019. We were not able to complete our discussion of carrier screening, but if desired that could also be drawn at that time.  Taylor Oneal was encouraged to call with questions or concerns.  We can be contacted at [phone].   Tests Ordered: none today.  Mat21 to be drawn 04/27/19.  I provided 35 minutes of non-face-to-face time during this encounter.   Rock Nephew, Henreitta Leber, MS, CGC

## 2019-04-23 ENCOUNTER — Other Ambulatory Visit: Payer: Self-pay

## 2019-04-23 DIAGNOSIS — O98711 Human immunodeficiency virus [HIV] disease complicating pregnancy, first trimester: Secondary | ICD-10-CM

## 2019-04-23 DIAGNOSIS — O09521 Supervision of elderly multigravida, first trimester: Secondary | ICD-10-CM

## 2019-04-23 DIAGNOSIS — O30031 Twin pregnancy, monochorionic/diamniotic, first trimester: Secondary | ICD-10-CM

## 2019-04-23 LAB — GENOSURE PRIME (GSPRIL)

## 2019-04-27 ENCOUNTER — Inpatient Hospital Stay: Admission: RE | Admit: 2019-04-27 | Payer: Medicaid Other | Source: Ambulatory Visit

## 2019-04-27 ENCOUNTER — Other Ambulatory Visit: Payer: Self-pay

## 2019-04-27 ENCOUNTER — Inpatient Hospital Stay
Admission: RE | Admit: 2019-04-27 | Discharge: 2019-04-27 | Disposition: A | Discharge status: Home / Self Care | Payer: Medicaid Other | Source: Ambulatory Visit

## 2019-04-27 DIAGNOSIS — O09521 Supervision of elderly multigravida, first trimester: Secondary | ICD-10-CM

## 2019-04-30 ENCOUNTER — Encounter: Payer: Self-pay | Admitting: Obstetrics and Gynecology

## 2019-04-30 NOTE — Progress Notes (Signed)
I checked on pt no show on 7/27 for her TTTS u/s  RN called the patient - she lives 60 minutes away- she will get her cell free dna drawn near home and come to [facility] for her anatomy scan . We can inquire then about making her logistics easier - perhaps the [facility] perinatal office would work for her for TTTS checks . Patient is afraid of disclosure of her hIV status and has travelled to avoid being identified as positive .  [facility] could offer her an alias if it would help .  Gatha Mayer 04/30/2019, 12:13 PM

## 2019-05-04 ENCOUNTER — Telehealth: Payer: Self-pay | Admitting: Obstetrics and Gynecology

## 2019-05-04 NOTE — Telephone Encounter (Signed)
I spoke with Ms. Amendola to follow up on her visit last week that she was unable to attend.  She would like to have the Lee drawn prior to her next ultrasound in September.  I ordered the MaterniT21 PLUS Core testing  (no SCA due to twins) to be drawn at [place] and she requested an appointment with a patient service center in [place].  I was able to schedule that visit for her on Wednesday, August 5 at 12:00 at [address] in [place].  Ordered were placed and should be available approximately 1 week from time of draw.  I will contact her with the results as soon as they become available.  Wilburt Finlay, MS, CGC

## 2019-05-25 ENCOUNTER — Encounter: Payer: Self-pay | Admitting: Obstetrics and Gynecology

## 2019-06-04 ENCOUNTER — Other Ambulatory Visit: Payer: Self-pay

## 2019-06-04 DIAGNOSIS — O30032 Twin pregnancy, monochorionic/diamniotic, second trimester: Secondary | ICD-10-CM

## 2019-06-04 DIAGNOSIS — O30031 Twin pregnancy, monochorionic/diamniotic, first trimester: Secondary | ICD-10-CM

## 2019-06-11 ENCOUNTER — Ambulatory Visit
Admission: RE | Admit: 2019-06-11 | Discharge: 2019-06-11 | Disposition: A | Discharge status: Home / Self Care | Payer: Medicaid Other | Source: Ambulatory Visit | Attending: Obstetrics and Gynecology | Admitting: Obstetrics and Gynecology

## 2019-06-11 ENCOUNTER — Other Ambulatory Visit: Payer: Self-pay

## 2019-06-11 ENCOUNTER — Encounter: Payer: Self-pay | Admitting: Obstetrics and Gynecology

## 2019-06-11 DIAGNOSIS — O09522 Supervision of elderly multigravida, second trimester: Secondary | ICD-10-CM

## 2019-06-11 DIAGNOSIS — O09521 Supervision of elderly multigravida, first trimester: Secondary | ICD-10-CM | POA: Diagnosis not present

## 2019-06-11 DIAGNOSIS — Z3A18 18 weeks gestation of pregnancy: Secondary | ICD-10-CM | POA: Insufficient documentation

## 2019-06-18 ENCOUNTER — Other Ambulatory Visit: Payer: Self-pay

## 2019-06-18 DIAGNOSIS — O30032 Twin pregnancy, monochorionic/diamniotic, second trimester: Secondary | ICD-10-CM

## 2019-06-22 ENCOUNTER — Other Ambulatory Visit: Payer: Self-pay

## 2019-06-22 ENCOUNTER — Ambulatory Visit
Admission: RE | Admit: 2019-06-22 | Discharge: 2019-06-22 | Disposition: A | Discharge status: Home / Self Care | Payer: Medicaid Other | Source: Ambulatory Visit | Attending: Maternal & Fetal Medicine | Admitting: Maternal & Fetal Medicine

## 2019-06-22 ENCOUNTER — Telehealth: Payer: Self-pay | Admitting: Obstetrics and Gynecology

## 2019-06-22 DIAGNOSIS — Z3A20 20 weeks gestation of pregnancy: Secondary | ICD-10-CM | POA: Insufficient documentation

## 2019-06-22 DIAGNOSIS — O30032 Twin pregnancy, monochorionic/diamniotic, second trimester: Secondary | ICD-10-CM | POA: Insufficient documentation

## 2019-06-22 LAB — MATERNIT21 PLUS CORE+SCA

## 2019-06-22 NOTE — Telephone Encounter (Signed)
The patient was informed of the results of her recent MaterniT21 testing which yielded NEGATIVE results.  The patient's specimen showed DNA consistent with two copies of chromosomes 21, 18 and 13.  The sensitivity for trisomy 27, trisomy 37 and trisomy 103 using this testing are reported as 99.1%, 99.9% and 91.7% respectively.  Thus, while the results of this testing are highly accurate, they are not considered diagnostic at this time.  Should more definitive information be desired, the patient may still consider amniocentesis.   As requested to know by the patient, sex chromosome analysis was included for this sample.  Results are consistent with both babies being female, as no Y chromosome material was detected. This is predicted with >99% accuracy.  A maternal serum AFP only should be considered if screening for neural tube defects is desired.  We may be reached at [phone] with any questions or concerns.   Wilburt Finlay, MS, CGC

## 2019-06-25 NOTE — Progress Notes (Signed)
Pt called [name] this am because she needed assistance with making an appointment with [facility] for OBGYN visit per MFM recommendation on 06/22/19.  Pt had cancelled prior appointment with them. [employer]  rescheduled her next appointment for 9/28 @ 9:00 AM.  I was also able to notify pt of new appointment at [facility] via telephone so she is aware.

## 2019-06-29 LAB — OB RESULTS CONSOLE HEPATITIS B SURFACE ANTIGEN: Hepatitis B Surface Ag: NEGATIVE

## 2019-06-29 LAB — OB RESULTS CONSOLE RUBELLA ANTIBODY, IGM: Rubella: IMMUNE

## 2019-06-29 LAB — OB RESULTS CONSOLE RPR: RPR: NONREACTIVE

## 2019-06-29 LAB — OB RESULTS CONSOLE VARICELLA ZOSTER ANTIBODY, IGG: Varicella: IMMUNE

## 2019-07-06 ENCOUNTER — Other Ambulatory Visit: Payer: Self-pay

## 2019-07-06 DIAGNOSIS — O30032 Twin pregnancy, monochorionic/diamniotic, second trimester: Secondary | ICD-10-CM

## 2019-07-09 ENCOUNTER — Inpatient Hospital Stay: Admission: RE | Admit: 2019-07-09 | Payer: Medicaid Other | Source: Ambulatory Visit

## 2019-07-16 ENCOUNTER — Ambulatory Visit: Payer: Medicaid Other

## 2019-07-20 ENCOUNTER — Ambulatory Visit: Payer: Medicaid Other

## 2019-07-23 ENCOUNTER — Ambulatory Visit
Admission: RE | Admit: 2019-07-23 | Discharge: 2019-07-23 | Disposition: A | Discharge status: Home / Self Care | Payer: Medicaid Other | Source: Ambulatory Visit | Attending: Maternal & Fetal Medicine | Admitting: Maternal & Fetal Medicine

## 2019-07-23 ENCOUNTER — Other Ambulatory Visit: Payer: Self-pay

## 2019-07-23 DIAGNOSIS — O09522 Supervision of elderly multigravida, second trimester: Secondary | ICD-10-CM | POA: Diagnosis not present

## 2019-07-23 DIAGNOSIS — Z3A24 24 weeks gestation of pregnancy: Secondary | ICD-10-CM | POA: Diagnosis not present

## 2019-07-23 DIAGNOSIS — O321XX1 Maternal care for breech presentation, fetus 1: Secondary | ICD-10-CM | POA: Diagnosis not present

## 2019-07-23 DIAGNOSIS — O30032 Twin pregnancy, monochorionic/diamniotic, second trimester: Secondary | ICD-10-CM | POA: Diagnosis not present

## 2019-07-23 DIAGNOSIS — O321XX2 Maternal care for breech presentation, fetus 2: Secondary | ICD-10-CM | POA: Diagnosis not present

## 2019-07-23 NOTE — Progress Notes (Signed)
MFM recommended pt have a Fluid Check in 2 weeks and Growth [place] in 4 weeks.  Pt not able to f/u in [facility] for fluid check because of scheduling conflicts with her job.  The fluid check was then bundled with her regular OBGYN appt at [facility] on Nov 3rd and her Growth Scan scheduled in 4 weeks at [facility].

## 2019-08-13 ENCOUNTER — Other Ambulatory Visit: Payer: Self-pay

## 2019-08-13 DIAGNOSIS — O30033 Twin pregnancy, monochorionic/diamniotic, third trimester: Secondary | ICD-10-CM

## 2019-08-13 DIAGNOSIS — O30032 Twin pregnancy, monochorionic/diamniotic, second trimester: Secondary | ICD-10-CM

## 2019-08-17 ENCOUNTER — Inpatient Hospital Stay
Admission: RE | Admit: 2019-08-17 | Discharge: 2019-08-17 | Disposition: A | Discharge status: Home / Self Care | Payer: Medicaid Other | Source: Ambulatory Visit

## 2019-09-03 ENCOUNTER — Ambulatory Visit
Admission: RE | Admit: 2019-09-03 | Discharge: 2019-09-03 | Disposition: A | Discharge status: Home / Self Care | Payer: Medicaid Other | Source: Ambulatory Visit | Attending: Obstetrics and Gynecology | Admitting: Obstetrics and Gynecology

## 2019-09-03 ENCOUNTER — Other Ambulatory Visit: Payer: Self-pay

## 2019-09-03 DIAGNOSIS — O30032 Twin pregnancy, monochorionic/diamniotic, second trimester: Secondary | ICD-10-CM

## 2019-09-03 DIAGNOSIS — Z3A30 30 weeks gestation of pregnancy: Secondary | ICD-10-CM | POA: Insufficient documentation

## 2019-09-03 DIAGNOSIS — O30033 Twin pregnancy, monochorionic/diamniotic, third trimester: Secondary | ICD-10-CM | POA: Diagnosis not present

## 2019-09-03 DIAGNOSIS — O30031 Twin pregnancy, monochorionic/diamniotic, first trimester: Secondary | ICD-10-CM

## 2019-09-07 ENCOUNTER — Ambulatory Visit: Payer: Medicaid Other

## 2019-09-14 ENCOUNTER — Other Ambulatory Visit: Payer: Self-pay

## 2019-09-14 DIAGNOSIS — O30033 Twin pregnancy, monochorionic/diamniotic, third trimester: Secondary | ICD-10-CM

## 2019-09-15 ENCOUNTER — Other Ambulatory Visit: Payer: Self-pay | Admitting: Obstetrics and Gynecology

## 2019-09-17 ENCOUNTER — Inpatient Hospital Stay
Admission: RE | Admit: 2019-09-17 | Discharge: 2019-09-17 | Disposition: A | Discharge status: Home / Self Care | Payer: Medicaid Other | Source: Ambulatory Visit

## 2019-09-17 NOTE — Progress Notes (Signed)
Pt was a "No Show" for scheduled [facility] appt. today for Twin to Twin.

## 2019-09-28 ENCOUNTER — Other Ambulatory Visit: Payer: Self-pay

## 2019-09-28 ENCOUNTER — Other Ambulatory Visit: Payer: Self-pay | Admitting: Obstetrics and Gynecology

## 2019-09-28 DIAGNOSIS — O30031 Twin pregnancy, monochorionic/diamniotic, first trimester: Secondary | ICD-10-CM

## 2019-09-28 DIAGNOSIS — O30039 Twin pregnancy, monochorionic/diamniotic, unspecified trimester: Secondary | ICD-10-CM

## 2019-09-29 LAB — OB RESULTS CONSOLE GC/CHLAMYDIA
Chlamydia: NEGATIVE
Gonorrhea: NEGATIVE

## 2019-10-05 ENCOUNTER — Inpatient Hospital Stay: Admission: RE | Admit: 2019-10-05 | Payer: Medicaid Other | Source: Ambulatory Visit

## 2019-10-07 ENCOUNTER — Other Ambulatory Visit
Admission: RE | Admit: 2019-10-07 | Discharge: 2019-10-07 | Disposition: A | Discharge status: Home / Self Care | Payer: Medicaid Other | Source: Ambulatory Visit | Attending: Obstetrics & Gynecology | Admitting: Obstetrics & Gynecology

## 2019-10-07 ENCOUNTER — Other Ambulatory Visit: Payer: Self-pay

## 2019-10-07 DIAGNOSIS — Z01812 Encounter for preprocedural laboratory examination: Secondary | ICD-10-CM | POA: Diagnosis present

## 2019-10-07 DIAGNOSIS — U071 COVID-19: Secondary | ICD-10-CM | POA: Diagnosis not present

## 2019-10-07 LAB — COMPREHENSIVE METABOLIC PANEL
ALT: 9 U/L (ref 0–44)
AST: 32 U/L (ref 15–41)
Albumin: 2.9 g/dL — ABNORMAL LOW (ref 3.5–5.0)
Alkaline Phosphatase: 155 U/L — ABNORMAL HIGH (ref 38–126)
Anion gap: 7 (ref 5–15)
BUN: <5 mg/dL — ABNORMAL LOW (ref 6–20)
CO2: 23 mmol/L (ref 22–32)
Calcium: 8.8 mg/dL — ABNORMAL LOW (ref 8.9–10.3)
Chloride: 104 mmol/L (ref 98–111)
Creatinine, Ser: 0.46 mg/dL (ref 0.44–1.00)
GFR calc Af Amer: >60 mL/min (ref >60)
GFR calc non Af Amer: >60 mL/min (ref >60)
Glucose, Bld: 73 mg/dL (ref 70–99)
Potassium: 3.4 mmol/L — ABNORMAL LOW (ref 3.5–5.1)
Sodium: 134 mmol/L — ABNORMAL LOW (ref 135–145)
Total Bilirubin: 0.9 mg/dL (ref 0.3–1.2)
Total Protein: 6.9 g/dL (ref 6.5–8.1)

## 2019-10-07 LAB — CBC
HCT: 29.3 % — ABNORMAL LOW (ref 36.0–46.0)
Hemoglobin: 10 g/dL — ABNORMAL LOW (ref 12.0–15.0)
MCH: 30.2 pg (ref 26.0–34.0)
MCHC: 34.1 g/dL (ref 30.0–36.0)
MCV: 88.5 fL (ref 80.0–100.0)
Platelets: 132 10*3/uL — ABNORMAL LOW (ref 150–400)
RBC: 3.31 MIL/uL — ABNORMAL LOW (ref 3.87–5.11)
RDW: 12 % (ref 11.5–15.5)
WBC: 4.3 10*3/uL (ref 4.0–10.5)
nRBC: 0 % (ref 0.0–0.2)

## 2019-10-07 LAB — TYPE AND SCREEN
ABO/RH(D): O POS
Antibody Screen: NEGATIVE
Extend sample reason: UNDETERMINED

## 2019-10-07 LAB — SARS CORONAVIRUS 2 (TAT 6-24 HRS): SARS Coronavirus 2: POSITIVE — AB

## 2019-10-08 ENCOUNTER — Telehealth: Payer: Self-pay | Admitting: *Deleted

## 2019-10-08 ENCOUNTER — Telehealth: Payer: Self-pay

## 2019-10-08 ENCOUNTER — Encounter: Payer: Self-pay | Admitting: *Deleted

## 2019-10-08 LAB — HELPER T-LYMPH-CD4 (ARMC ONLY)
% CD 4 Pos. Lymph.: 34 % (ref 30.8–58.5)
Absolute CD 4 Helper: 238 /uL — ABNORMAL LOW (ref 359–1519)
Basophils Absolute: 0 10*3/uL (ref 0.0–0.2)
Basos: 0 %
EOS (ABSOLUTE): 0.2 10*3/uL (ref 0.0–0.4)
Eos: 4 %
Hematocrit: 29.4 % — ABNORMAL LOW (ref 34.0–46.6)
Hemoglobin: 10 g/dL — ABNORMAL LOW (ref 11.1–15.9)
Immature Grans (Abs): 0.1 10*3/uL (ref 0.0–0.1)
Immature Granulocytes: 1 %
Lymphocytes Absolute: 0.7 10*3/uL (ref 0.7–3.1)
Lymphs: 17 %
MCH: 30.2 pg (ref 26.6–33.0)
MCHC: 34 g/dL (ref 31.5–35.7)
MCV: 89 fL (ref 79–97)
Monocytes Absolute: 0.7 10*3/uL (ref 0.1–0.9)
Monocytes: 17 %
Neutrophils Absolute: 2.4 10*3/uL (ref 1.4–7.0)
Neutrophils: 61 %
Platelets: 127 10*3/uL — ABNORMAL LOW (ref 150–450)
RBC: 3.31 x10E6/uL — ABNORMAL LOW (ref 3.77–5.28)
RDW: 12.3 % (ref 11.7–15.4)
WBC: 4 10*3/uL (ref 3.4–10.8)

## 2019-10-08 LAB — RPR

## 2019-10-08 LAB — HIV-1 RNA QUANT-NO REFLEX-BLD
HIV 1 RNA Quant: <20 copies/mL
LOG10 HIV-1 RNA: UNDETERMINED log10copy/mL

## 2019-10-08 NOTE — Telephone Encounter (Signed)
Pt was notified that she tested positive for covid 19. She voiced understanding. Before giving her recommendations or reviewing her symptoms, the call was disconnected.  Attempted to call back, her fiance answered the phone and the call again was disconnected. Will reach out to her thru her MyChart.

## 2019-10-08 NOTE — Telephone Encounter (Signed)
Someone answered phone and said he was out with the phone at this time and would have her call back when he goes home.

## 2019-10-08 NOTE — Plan of Care (Addendum)
THIS NOTE WAS WRITTEN ON 10/08/19, AT 12:53PM.  NO IDEA WHY IT IS FILING FOR YESTERDAY'S DATE.   Patient is scheduled to have repeat cesarean + BTL on 10/09/2019 for mono-di twins @ 36+0 weeks per MFM recommendations.  She is HIV positive, and has delivered with our hospital in the past.    She is scheduled to arrive at 01:00 on 10/09/2019 to be admitted and then receive AZT for 3 hours prior to her delivery; this is ordered.  After delivery the neonates will receive 4 HIV medications, and I confirmed these were ordered and are available in the pharmacy for distribution after delivery.  New hiccup: patient is newly positive for COVID.  She is aware, and reached out to me to make me aware.   We are able to admit the patient and care for her in a negative pressure room, as well as in the OR as scheduled.  Currently we have only one isolation area for neonates in the NICU, and staff is working on solving that issue.  Not sure why they need to be in the NICU, except if to deliver IV medications - her newborn was not in the NICU last admission.  I will attempt to find out the process for their treatment.  ----- Larey Days, MD, [place] Attending Obstetrician and Gynecologist [facility], Department of [facility]

## 2019-10-09 ENCOUNTER — Encounter: Admission: RE | Payer: Self-pay | Source: Ambulatory Visit

## 2019-10-09 ENCOUNTER — Inpatient Hospital Stay
Admission: RE | Admit: 2019-10-09 | Payer: Medicaid Other | Source: Ambulatory Visit | Admitting: Obstetrics & Gynecology

## 2019-10-09 SURGERY — Surgical Case
Anesthesia: Spinal | Laterality: Bilateral

## 2019-10-10 LAB — OB RESULTS CONSOLE HIV ANTIBODY (ROUTINE TESTING): HIV: REACTIVE

## 2019-10-13 LAB — OB RESULTS CONSOLE GBS: GBS: POSITIVE

## 2019-10-21 ENCOUNTER — Other Ambulatory Visit: Payer: Medicaid Other

## 2019-10-22 ENCOUNTER — Other Ambulatory Visit: Payer: Medicaid Other

## 2019-10-23 ENCOUNTER — Inpatient Hospital Stay: Payer: Medicaid Other | Admitting: Anesthesiology

## 2019-10-23 ENCOUNTER — Inpatient Hospital Stay
Admission: EM | Admit: 2019-10-23 | Discharge: 2019-10-25 | DRG: 784 | Disposition: A | Discharge status: Home / Self Care | Payer: Medicaid Other | Attending: Obstetrics & Gynecology | Admitting: Obstetrics & Gynecology

## 2019-10-23 ENCOUNTER — Inpatient Hospital Stay
Admission: RE | Admit: 2019-10-23 | Payer: Medicaid Other | Source: Home / Self Care | Admitting: Obstetrics & Gynecology

## 2019-10-23 ENCOUNTER — Other Ambulatory Visit: Payer: Self-pay

## 2019-10-23 ENCOUNTER — Encounter: Payer: Self-pay | Admitting: Obstetrics and Gynecology

## 2019-10-23 ENCOUNTER — Encounter
Admission: EM | Disposition: A | Discharge status: Home / Self Care | Payer: Self-pay | Source: Home / Self Care | Attending: Obstetrics & Gynecology

## 2019-10-23 DIAGNOSIS — Z8616 Personal history of COVID-19: Secondary | ICD-10-CM

## 2019-10-23 DIAGNOSIS — Z23 Encounter for immunization: Secondary | ICD-10-CM

## 2019-10-23 DIAGNOSIS — O9872 Human immunodeficiency virus [HIV] disease complicating childbirth: Secondary | ICD-10-CM | POA: Diagnosis present

## 2019-10-23 DIAGNOSIS — O34211 Maternal care for low transverse scar from previous cesarean delivery: Secondary | ICD-10-CM | POA: Diagnosis present

## 2019-10-23 DIAGNOSIS — D649 Anemia, unspecified: Secondary | ICD-10-CM | POA: Diagnosis present

## 2019-10-23 DIAGNOSIS — O9279 Other disorders of lactation: Secondary | ICD-10-CM | POA: Diagnosis present

## 2019-10-23 DIAGNOSIS — O99214 Obesity complicating childbirth: Secondary | ICD-10-CM | POA: Diagnosis present

## 2019-10-23 DIAGNOSIS — Z3A38 38 weeks gestation of pregnancy: Secondary | ICD-10-CM | POA: Diagnosis not present

## 2019-10-23 DIAGNOSIS — O30033 Twin pregnancy, monochorionic/diamniotic, third trimester: Secondary | ICD-10-CM | POA: Diagnosis present

## 2019-10-23 DIAGNOSIS — Z21 Asymptomatic human immunodeficiency virus [HIV] infection status: Secondary | ICD-10-CM | POA: Diagnosis present

## 2019-10-23 DIAGNOSIS — O9902 Anemia complicating childbirth: Secondary | ICD-10-CM | POA: Diagnosis present

## 2019-10-23 DIAGNOSIS — E669 Obesity, unspecified: Secondary | ICD-10-CM | POA: Diagnosis present

## 2019-10-23 DIAGNOSIS — O99824 Streptococcus B carrier state complicating childbirth: Secondary | ICD-10-CM | POA: Diagnosis present

## 2019-10-23 DIAGNOSIS — O0993 Supervision of high risk pregnancy, unspecified, third trimester: Secondary | ICD-10-CM

## 2019-10-23 DIAGNOSIS — Z302 Encounter for sterilization: Secondary | ICD-10-CM

## 2019-10-23 LAB — TYPE AND SCREEN
ABO/RH(D): O POS
Antibody Screen: NEGATIVE

## 2019-10-23 LAB — BASIC METABOLIC PANEL
Anion gap: 10 (ref 5–15)
BUN: 6 mg/dL (ref 6–20)
CO2: 21 mmol/L — ABNORMAL LOW (ref 22–32)
Calcium: 8.9 mg/dL (ref 8.9–10.3)
Chloride: 105 mmol/L (ref 98–111)
Creatinine, Ser: 0.53 mg/dL (ref 0.44–1.00)
GFR calc Af Amer: >60 mL/min (ref >60)
GFR calc non Af Amer: >60 mL/min (ref >60)
Glucose, Bld: 77 mg/dL (ref 70–99)
Potassium: 3.4 mmol/L — ABNORMAL LOW (ref 3.5–5.1)
Sodium: 136 mmol/L (ref 135–145)

## 2019-10-23 LAB — CBC
HCT: 30.5 % — ABNORMAL LOW (ref 36.0–46.0)
Hemoglobin: 10.1 g/dL — ABNORMAL LOW (ref 12.0–15.0)
MCH: 29.9 pg (ref 26.0–34.0)
MCHC: 33.1 g/dL (ref 30.0–36.0)
MCV: 90.2 fL (ref 80.0–100.0)
Platelets: 161 10*3/uL (ref 150–400)
RBC: 3.38 MIL/uL — ABNORMAL LOW (ref 3.87–5.11)
RDW: 12.3 % (ref 11.5–15.5)
WBC: 3.8 10*3/uL — ABNORMAL LOW (ref 4.0–10.5)
nRBC: 0 % (ref 0.0–0.2)

## 2019-10-23 SURGERY — Surgical Case
Anesthesia: Spinal | Laterality: Bilateral

## 2019-10-23 MED ORDER — MORPHINE SULFATE (PF) 0.5 MG/ML IJ SOLN
INTRAMUSCULAR | Status: DC | PRN
  Administered 2019-10-23: .1 mg via EPIDURAL

## 2019-10-23 MED ORDER — PROMETHAZINE HCL 25 MG/ML IJ SOLN: 12.5000 mg | INTRAMUSCULAR | Status: DC | PRN

## 2019-10-23 MED ORDER — KETOROLAC TROMETHAMINE 30 MG/ML IJ SOLN: 30.0000 mg | Freq: Four times a day (QID) | INTRAMUSCULAR | Status: DC

## 2019-10-23 MED ORDER — NALOXONE HCL 0.4 MG/ML IJ SOLN: 0.4000 mg | INTRAMUSCULAR | Status: DC | PRN

## 2019-10-23 MED ORDER — SODIUM CHLORIDE 0.9% FLUSH: 3.0000 mL | INTRAVENOUS | Status: DC | PRN

## 2019-10-23 MED ORDER — PHENYLEPHRINE HCL (PRESSORS) 10 MG/ML IV SOLN
INTRAVENOUS | Status: DC | PRN
  Administered 2019-10-23: 100 ug via INTRAVENOUS
  Administered 2019-10-23: 200 ug via INTRAVENOUS
  Administered 2019-10-23 (×3): 100 ug via INTRAVENOUS

## 2019-10-23 MED ORDER — OXYCODONE HCL 5 MG PO TABS
5.0000 mg | ORAL_TABLET | ORAL | Status: DC | PRN
  Administered 2019-10-25: 18:00:00 5 mg via ORAL
  Filled 2019-10-23: qty 1

## 2019-10-23 MED ORDER — FENTANYL CITRATE (PF) 100 MCG/2ML IJ SOLN
INTRAMUSCULAR | Status: DC | PRN
  Administered 2019-10-23: 15 ug via INTRATHECAL

## 2019-10-23 MED ORDER — KETOROLAC TROMETHAMINE 30 MG/ML IJ SOLN
30.0000 mg | Freq: Four times a day (QID) | INTRAMUSCULAR | Status: DC
  Administered 2019-10-23: 30 mg via INTRAVENOUS
  Filled 2019-10-23: qty 1

## 2019-10-23 MED ORDER — OXYTOCIN 10 UNIT/ML IJ SOLN
INTRAMUSCULAR | Status: DC | PRN
  Administered 2019-10-23: 3 [IU] via INTRAMUSCULAR

## 2019-10-23 MED ORDER — KETOROLAC TROMETHAMINE 30 MG/ML IJ SOLN
INTRAMUSCULAR | Status: DC | PRN
  Administered 2019-10-23: 30 mg via INTRAVENOUS

## 2019-10-23 MED ORDER — BUPIVACAINE HCL (PF) 0.5 % IJ SOLN: 30.0000 mL | Freq: Once | INTRAMUSCULAR | Status: DC

## 2019-10-23 MED ORDER — ONDANSETRON HCL 4 MG/2ML IJ SOLN
4.0000 mg | Freq: Four times a day (QID) | INTRAMUSCULAR | Status: DC | PRN
  Administered 2019-10-24: 4 mg via INTRAVENOUS
  Filled 2019-10-23: qty 2

## 2019-10-23 MED ORDER — LACTATED RINGERS IV SOLN: INTRAVENOUS | Status: DC | PRN

## 2019-10-23 MED ORDER — BUPIVACAINE LIPOSOME 1.3 % IJ SUSP: 20.0000 mL | Freq: Once | INTRAMUSCULAR | Status: DC

## 2019-10-23 MED ORDER — KETOROLAC TROMETHAMINE 30 MG/ML IJ SOLN: 30.0000 mg | Freq: Four times a day (QID) | INTRAMUSCULAR | Status: AC

## 2019-10-23 MED ORDER — OXYCODONE HCL 5 MG PO TABS: 10.0000 mg | ORAL_TABLET | ORAL | Status: DC | PRN

## 2019-10-23 MED ORDER — ACETAMINOPHEN 325 MG PO TABS: 650.0000 mg | ORAL_TABLET | Freq: Four times a day (QID) | ORAL | Status: DC

## 2019-10-23 MED ORDER — NALBUPHINE HCL 10 MG/ML IJ SOLN: 5.0000 mg | Freq: Once | INTRAMUSCULAR | Status: DC | PRN

## 2019-10-23 MED ORDER — BUPIVACAINE LIPOSOME 1.3 % IJ SUSP
INTRAMUSCULAR | Status: DC | PRN
  Administered 2019-10-23: 20 mL

## 2019-10-23 MED ORDER — KETOROLAC TROMETHAMINE 30 MG/ML IJ SOLN
30.0000 mg | Freq: Four times a day (QID) | INTRAMUSCULAR | Status: AC
  Administered 2019-10-24 (×2): 30 mg via INTRAVENOUS
  Filled 2019-10-23 (×3): qty 1

## 2019-10-23 MED ORDER — BUPIVACAINE HCL (PF) 0.5 % IJ SOLN
INTRAMUSCULAR | Status: AC
  Filled 2019-10-23: qty 30

## 2019-10-23 MED ORDER — ACETAMINOPHEN 325 MG PO TABS
650.0000 mg | ORAL_TABLET | Freq: Four times a day (QID) | ORAL | Status: AC
  Administered 2019-10-24 (×2): 650 mg via ORAL
  Filled 2019-10-23 (×3): qty 2

## 2019-10-23 MED ORDER — LACTATED RINGERS IV SOLN: INTRAVENOUS | Status: DC

## 2019-10-23 MED ORDER — OXYTOCIN 40 UNITS IN NORMAL SALINE INFUSION - SIMPLE MED
INTRAVENOUS | Status: DC | PRN
  Administered 2019-10-23: 1000 mL via INTRAVENOUS

## 2019-10-23 MED ORDER — ZIDOVUDINE 10 MG/ML IV SOLN
1.0000 mg/kg/h | INTRAVENOUS | Status: DC
  Administered 2019-10-23: 1 mg/kg/h via INTRAVENOUS
  Filled 2019-10-23: qty 40

## 2019-10-23 MED ORDER — ZIDOVUDINE 10 MG/ML IV SOLN
2.0000 mg/kg | Freq: Once | INTRAVENOUS | Status: AC
  Administered 2019-10-23: 195 mg via INTRAVENOUS
  Filled 2019-10-23: qty 19.5

## 2019-10-23 MED ORDER — PHENYLEPHRINE HCL-NACL 20-0.9 MG/250ML-% IV SOLN
INTRAVENOUS | Status: DC | PRN
  Administered 2019-10-23: 50 ug/min via INTRAVENOUS

## 2019-10-23 MED ORDER — CEFAZOLIN SODIUM-DEXTROSE 2-4 GM/100ML-% IV SOLN
2.0000 g | INTRAVENOUS | Status: AC
  Administered 2019-10-23: 2 g via INTRAVENOUS
  Filled 2019-10-23: qty 100

## 2019-10-23 MED ORDER — MEPERIDINE HCL 25 MG/ML IJ SOLN: 6.2500 mg | INTRAMUSCULAR | Status: DC | PRN

## 2019-10-23 MED ORDER — SIMETHICONE 80 MG PO CHEW: 160.0000 mg | CHEWABLE_TABLET | Freq: Four times a day (QID) | ORAL | Status: DC | PRN

## 2019-10-23 MED ORDER — SODIUM CHLORIDE (PF) 0.9 % IJ SOLN
INTRAMUSCULAR | Status: AC
  Filled 2019-10-23: qty 50

## 2019-10-23 MED ORDER — ONDANSETRON HCL 4 MG/2ML IJ SOLN
INTRAMUSCULAR | Status: DC | PRN
  Administered 2019-10-23: 4 mg via INTRAVENOUS

## 2019-10-23 MED ORDER — OXYTOCIN 40 UNITS IN NORMAL SALINE INFUSION - SIMPLE MED
INTRAVENOUS | Status: AC
  Filled 2019-10-23: qty 1000

## 2019-10-23 MED ORDER — SOD CITRATE-CITRIC ACID 500-334 MG/5ML PO SOLN
30.0000 mL | ORAL | Status: AC
  Administered 2019-10-23: 30 mL via ORAL

## 2019-10-23 MED ORDER — SODIUM CHLORIDE 0.9% FLUSH
INTRAVENOUS | Status: DC | PRN
  Administered 2019-10-23: 50 mL via INTRADERMAL

## 2019-10-23 MED ORDER — NALBUPHINE HCL 10 MG/ML IJ SOLN: 5.0000 mg | INTRAMUSCULAR | Status: DC | PRN

## 2019-10-23 MED ORDER — ACETAMINOPHEN 650 MG RE SUPP
650.0000 mg | Freq: Once | RECTAL | Status: DC
  Filled 2019-10-23 (×2): qty 1

## 2019-10-23 MED ORDER — DIPHENHYDRAMINE HCL 50 MG/ML IJ SOLN
12.5000 mg | INTRAMUSCULAR | Status: DC | PRN
  Filled 2019-10-23: qty 1

## 2019-10-23 MED ORDER — DIPHENHYDRAMINE HCL 25 MG PO CAPS: 25.0000 mg | ORAL_CAPSULE | ORAL | Status: DC | PRN

## 2019-10-23 MED ORDER — NALOXONE HCL 4 MG/10ML IJ SOLN
1.0000 ug/kg/h | INTRAVENOUS | Status: DC | PRN
  Filled 2019-10-23: qty 5

## 2019-10-23 MED ORDER — BUPIVACAINE HCL 0.5 % IJ SOLN
INTRAMUSCULAR | Status: DC | PRN
  Administered 2019-10-23: 30 mL

## 2019-10-23 MED ORDER — BUPIVACAINE LIPOSOME 1.3 % IJ SUSP
INTRAMUSCULAR | Status: AC
  Filled 2019-10-23: qty 20

## 2019-10-23 MED ORDER — ONDANSETRON HCL 4 MG/2ML IJ SOLN
4.0000 mg | Freq: Three times a day (TID) | INTRAMUSCULAR | Status: DC | PRN
  Administered 2019-10-23: 4 mg via INTRAVENOUS
  Filled 2019-10-23: qty 2

## 2019-10-23 MED ORDER — LACTATED RINGERS IV BOLUS
500.0000 mL | Freq: Once | INTRAVENOUS | Status: AC
  Administered 2019-10-23: 500 mL via INTRAVENOUS

## 2019-10-23 MED ORDER — BUPIVACAINE IN DEXTROSE 0.75-8.25 % IT SOLN
INTRATHECAL | Status: DC | PRN
  Administered 2019-10-23: 1.6 mL via INTRATHECAL

## 2019-10-23 MED ORDER — SOD CITRATE-CITRIC ACID 500-334 MG/5ML PO SOLN
ORAL | Status: AC
  Filled 2019-10-23: qty 30

## 2019-10-23 SURGICAL SUPPLY — 37 items
BARRIER ADHS 3X4 INTERCEED (GAUZE/BANDAGES/DRESSINGS) ×6 IMPLANT
CANISTER SUCT 3000ML PPV (MISCELLANEOUS) ×3 IMPLANT
CLOSURE WOUND 1/2 X4 (GAUZE/BANDAGES/DRESSINGS) ×1
COVER WAND RF STERILE (DRAPES) ×3 IMPLANT
DERMABOND ADVANCED (GAUZE/BANDAGES/DRESSINGS) ×2
DERMABOND ADVANCED .7 DNX12 (GAUZE/BANDAGES/DRESSINGS) ×1 IMPLANT
DRSG TELFA 3X8 NADH (GAUZE/BANDAGES/DRESSINGS) ×3 IMPLANT
ELECT CAUTERY BLADE 6.4 (BLADE) ×3 IMPLANT
ELECT REM PT RETURN 9FT ADLT (ELECTROSURGICAL) ×3
ELECTRODE REM PT RTRN 9FT ADLT (ELECTROSURGICAL) ×1 IMPLANT
GAUZE SPONGE 4X4 12PLY STRL (GAUZE/BANDAGES/DRESSINGS) ×3 IMPLANT
GLOVE PI ORTHOPRO 6.5 (GLOVE) ×2
GLOVE PI ORTHOPRO STRL 6.5 (GLOVE) ×1 IMPLANT
GLOVE SURG SYN 6.5 ES PF (GLOVE) ×3 IMPLANT
GOWN STRL REUS W/ TWL LRG LVL3 (GOWN DISPOSABLE) ×3 IMPLANT
GOWN STRL REUS W/TWL LRG LVL3 (GOWN DISPOSABLE) ×6
NS IRRIG 1000ML POUR BTL (IV SOLUTION) ×3 IMPLANT
PACK C SECTION AR (MISCELLANEOUS) ×3 IMPLANT
PAD OB MATERNITY 4.3X12.25 (PERSONAL CARE ITEMS) ×3 IMPLANT
PAD PREP 24X41 OB/GYN DISP (PERSONAL CARE ITEMS) ×3 IMPLANT
PENCIL SMOKE ULTRAEVAC 22 CON (MISCELLANEOUS) ×3 IMPLANT
SPONGE LAP 18X18 RF (DISPOSABLE) ×3 IMPLANT
STAPLER INSORB 30 2030 C-SECTI (MISCELLANEOUS) ×3 IMPLANT
STRAP SAFETY 5IN WIDE (MISCELLANEOUS) ×3 IMPLANT
STRIP CLOSURE SKIN 1/2X4 (GAUZE/BANDAGES/DRESSINGS) ×2 IMPLANT
SUT MNCRL 4-0 (SUTURE) ×2
SUT MNCRL 4-0 27XMFL (SUTURE) ×1
SUT PDS AB 1 TP1 96 (SUTURE) ×3 IMPLANT
SUT VIC AB 0 CT1 36 (SUTURE) ×6 IMPLANT
SUT VIC AB 2-0 CT1 27 (SUTURE) ×2
SUT VIC AB 2-0 CT1 TAPERPNT 27 (SUTURE) ×1 IMPLANT
SUT VIC AB 2-0 SH 27 (SUTURE) ×6
SUT VIC AB 2-0 SH 27XBRD (SUTURE) ×3 IMPLANT
SUT VIC AB 3-0 SH 27 (SUTURE) ×2
SUT VIC AB 3-0 SH 27X BRD (SUTURE) ×1 IMPLANT
SUTURE MNCRL 4-0 27XMF (SUTURE) ×1 IMPLANT
SWABSTK COMLB BENZOIN TINCTURE (MISCELLANEOUS) ×3 IMPLANT

## 2019-10-23 NOTE — Transfer of Care (Signed)
Immediate Anesthesia Transfer of Care Note  Patient: Taylor Oneal  Procedure(s) Performed: CESAREAN SECTION WITH BILATERAL TUBAL LIGATION (Bilateral )  Patient Location: PACU and Mother/Baby  Anesthesia Type:Spinal  Level of Consciousness: awake  Airway & Oxygen Therapy: Patient Spontanous Breathing  Post-op Assessment: Report given to RN  Post vital signs: stable  Last Vitals:  Vitals Value Taken Time  BP    Temp    Pulse    Resp    SpO2      Last Pain:  Vitals:   10/23/19 0720  TempSrc:   PainSc: 0-No pain         Complications: No apparent anesthesia complications

## 2019-10-23 NOTE — H&P (Signed)
Preoperative History and Physical  Taylor Oneal is a 39 y.o. [id number] here for surgical management of twin pregnancy and permanent sterilization.  She is HIV positive, with an undetectable viral load and s/p bolus +3hrs of AZT infusion.   No significant preoperative concerns.  She is covid positive and s/p 14 days quarantine.  Proposed surgery: cesarean delivery with tubal ligation  Past Medical History:  Diagnosis Date  . Anemia   . Asthma    long time ago per patient   . HIV (human immunodeficiency virus infection) ([place])   . Thyroid disease    Past Surgical History:  Procedure Laterality Date  . CESAREAN SECTION N/A 06/23/2016   Procedure: CESAREAN SECTION;  Surgeon: Honor Loh Bralin Garry, MD;  Location: [facility] ORS;  Service: Obstetrics;  Laterality: N/A;  . CESAREAN SECTION N/A 11/01/2017   Procedure: CESAREAN SECTION;  Surgeon: Lorayne Getchell, Honor Loh, MD;  Location: [facility] ORS;  Service: Obstetrics;  Laterality: N/A;   OB History  Gravida Para Term Preterm AB Living  3 2 2     2   SAB TAB Ectopic Multiple Live Births        0 1    # Outcome Date GA Lbr Len/2nd Weight Sex Delivery Anes PTL Lv  3 Current           2 Term 11/01/17 [redacted]w[redacted]d  3530 g M CS-LTranv Spinal  LIV  1 Term 06/23/16 [redacted]w[redacted]d  3500 g Berenice Bouton EPI    Patient denies any other pertinent gynecologic issues.   No current facility-administered medications on file prior to encounter.   Current Outpatient Medications on File Prior to Encounter  Medication Sig Dispense Refill  . clotrimazole (LOTRIMIN) 1 % cream Apply 1 application topically 2 (two) times daily. 15GM    . darunavir (PREZISTA) 600 MG tablet Take 600 mg by mouth 2 (two) times a day.     . emtricitabine-tenofovir (TRUVADA) 200-300 MG tablet Take 1 tablet by mouth daily. 30 tablet 1  . Prenatal Vit-Fe Fumarate-FA (PRENATAL MULTIVITAMIN) TABS tablet Take 1 tablet by mouth daily.     . ritonavir (NORVIR) 100 MG TABS tablet Take 100 mg by mouth daily with breakfast.     .  sertraline (ZOLOFT) 25 MG tablet Take 25 mg by mouth daily.     No Known Allergies  Social History:   reports that she has never smoked. She has never used smokeless tobacco. She reports that she does not drink alcohol or use drugs.  Family History  Problem Relation Age of Onset  . Hypertension Mother   . Thyroid disease Mother   . Depression Mother   . Diabetes Father   . Hypertension Father   . Hypertension Maternal Grandfather     Review of Systems: Noncontributory  PHYSICAL EXAM: Blood pressure 115/78, pulse 90, temperature 98.5 F (36.9 C), temperature source Oral, resp. rate 18, height 5\' 4"  (1.626 m), weight 97.5 kg, last menstrual period 01/30/2019, SpO2 99 %, unknown if currently breastfeeding. General appearance - alert, well appearing, and in no distress Chest - clear to auscultation, no wheezes, rales or rhonchi, symmetric air entry Heart - normal rate and regular rhythm Abdomen - soft, nontender, nondistended, no masses or organomegaly Pelvic - examination not indicated Extremities - peripheral pulses normal, no pedal edema, no clubbing or cyanosis  Labs: Results for orders placed or performed during the hospital encounter of 10/23/19 (from the past 336 hour(s))  OB RESULTS CONSOLE HIV antibody   Collection Time: 10/10/19 12:00  AM  Result Value Ref Range   HIV Reactive   OB RESULT CONSOLE Group B Strep   Collection Time: 10/13/19 12:00 AM  Result Value Ref Range   GBS Positive   CBC   Collection Time: 10/23/19  7:17 AM  Result Value Ref Range   WBC 3.8 (L) 4.0 - 10.5 K/uL   RBC 3.38 (L) 3.87 - 5.11 MIL/uL   Hemoglobin 10.1 (L) 12.0 - 15.0 g/dL   HCT 30.5 (L) 36.0 - 46.0 %   MCV 90.2 80.0 - 100.0 fL   MCH 29.9 26.0 - 34.0 pg   MCHC 33.1 30.0 - 36.0 g/dL   RDW 12.3 11.5 - 15.5 %   Platelets 161 150 - 400 K/uL   nRBC 0.0 0.0 - 0.2 %  Basic metabolic panel   Collection Time: 10/23/19  7:17 AM  Result Value Ref Range   Sodium 136 135 - 145 mmol/L    Potassium 3.4 (L) 3.5 - 5.1 mmol/L   Chloride 105 98 - 111 mmol/L   CO2 21 (L) 22 - 32 mmol/L   Glucose, Bld 77 70 - 99 mg/dL   BUN 6 6 - 20 mg/dL   Creatinine, Ser 0.53 0.44 - 1.00 mg/dL   Calcium 8.9 8.9 - 10.3 mg/dL   GFR calc non Af Amer >60 >60 mL/min   GFR calc Af Amer >60 >60 mL/min   Anion gap 10 5 - 15  Type and screen [place]   Collection Time: 10/23/19  7:17 AM  Result Value Ref Range   ABO/RH(D) O POS    Antibody Screen NEG    Sample Expiration      10/26/2019,2359 Performed at [facility], [address], [place], [place] [address]     Imaging Studies: No results found.  Assessment: Patient Active Problem List   Diagnosis Date Noted  . Pregnancy, supervision, high-risk, third trimester 10/23/2019  . Advanced maternal age in multigravida, first trimester 03/30/2019  . Monochorionic diamniotic twin gestation 03/30/2019  . Hyperemesis affecting pregnancy, antepartum 03/30/2019  . Axillary hidradenitis suppurativa 03/30/2019  . Eczema of right upper extremity versus tinea  03/30/2019  . Thyroid disorder 03/30/2019  . Short interval between pregnancies affecting pregnancy in first trimester, antepartum 03/30/2019  . pt desires BTL with cesarean  03/30/2019  . Depression 03/30/2019  . Obesity affecting pregnancy 03/30/2019  . Previous cesarean delivery, antepartum condition or complication 0000000  . HIV disease affecting pregnancy 06/23/2016  . Anemia affecting pregnancy 06/23/2016  . Fibroid uterus 06/23/2016  . Asthma affecting pregnancy, antepartum 06/23/2016    Plan: Patient will undergo surgical management with repeat cesarean delivery with tubal ligation.   The risks of surgery were discussed in detail with the patient including but not limited to: bleeding which may require transfusion or reoperation; infection which may require antibiotics; injury to surrounding organs which may involve bowel, bladder, ureters ; need  for additional procedures including laparoscopy or laparotomy; thromboembolic phenomenon, surgical site problems and other postoperative/anesthesia complications. Likelihood of success in alleviating the patient's condition was discussed. Routine postoperative instructions will be reviewed with the patient and her family in detail after surgery.  The patient concurred with the proposed plan, giving informed written consent for the surgery.  Patient has been NPO since last night she will remain NPO for procedure.  Anesthesia and OR aware.  Preoperative prophylactic antibiotics and SCDs ordered on call to the OR.  To OR when ready.  ----- Larey Days, MD,  [place] Attending Obstetrician and Gynecologist [facility], Department of [facility]

## 2019-10-23 NOTE — Anesthesia Preprocedure Evaluation (Signed)
Anesthesia Evaluation  Patient identified by MRN, date of birth, ID band Patient awake    Reviewed: Allergy & Precautions, NPO status , Patient's Chart, lab work & pertinent test results  History of Anesthesia Complications Negative for: history of anesthetic complications  Airway Mallampati: II  TM Distance: >3 FB Neck ROM: Full    Dental  (+) Poor Dentition, Missing,    Pulmonary asthma (childhood) ,    breath sounds clear to auscultation- rhonchi (-) wheezing      Cardiovascular Exercise Tolerance: Good (-) hypertension(-) CAD, (-) Past MI, (-) Cardiac Stents and (-) CABG  Rhythm:Regular Rate:Normal - Systolic murmurs and - Diastolic murmurs    Neuro/Psych neg Seizures PSYCHIATRIC DISORDERS Depression negative neurological ROS     GI/Hepatic negative GI ROS, Neg liver ROS,   Endo/Other  negative endocrine ROSneg diabetes  Renal/GU negative Renal ROS     Musculoskeletal negative musculoskeletal ROS (+)   Abdominal (+) + obese, Gravid abdomen   Peds  Hematology  (+) anemia ,   Anesthesia Other Findings Past Medical History: No date: Anemia No date: Asthma     Comment:  long time ago per patient  No date: HIV (human immunodeficiency virus infection) ([facility]) No date: Thyroid disease   Reproductive/Obstetrics (+) Pregnancy (mo-di twins)                             Lab Results  Component Value Date   WBC 3.8 (L) 10/23/2019   HGB 10.1 (L) 10/23/2019   HCT 30.5 (L) 10/23/2019   MCV 90.2 10/23/2019   PLT 161 10/23/2019    Anesthesia Physical Anesthesia Plan  ASA: III  Anesthesia Plan: Spinal   Post-op Pain Management:    Induction:   PONV Risk Score and Plan: 2 and Ondansetron  Airway Management Planned: Natural Airway  Additional Equipment:   Intra-op Plan:   Post-operative Plan:   Informed Consent: I have reviewed the patients History and Physical, chart, labs and  discussed the procedure including the risks, benefits and alternatives for the proposed anesthesia with the patient or authorized representative who has indicated his/her understanding and acceptance.     Dental advisory given  Plan Discussed with: CRNA and Anesthesiologist  Anesthesia Plan Comments:         Anesthesia Quick Evaluation

## 2019-10-23 NOTE — Anesthesia Procedure Notes (Signed)
Spinal  Patient location during procedure: OR Start time: 10/23/2019 12:20 PM End time: 10/23/2019 12:24 PM Staffing Performed: anesthesiologist and resident/CRNA  Anesthesiologist: Emmie Niemann, MD Resident/CRNA: Natasha Mead, CRNA Preanesthetic Checklist Completed: patient identified, IV checked, site marked, risks and benefits discussed, surgical consent, monitors and equipment checked, pre-op evaluation and timeout performed Spinal Block Patient position: sitting Prep: ChloraPrep Patient monitoring: heart rate, continuous pulse ox and blood pressure Approach: midline Location: L3-4 Injection technique: single-shot Needle Needle type: Introducer and Pencil-Tip  Needle gauge: 24 G Needle length: 9 cm Assessment Sensory level: T4

## 2019-10-23 NOTE — Op Note (Addendum)
Cesarean Section Procedure Note  10/23/2019  Patient:  Taylor Oneal  39 y.o. female at [redacted]w[redacted]d.  Patient's last menstrual period was 01/30/2019 (exact date). Preoperative diagnosis:  prior cesarean, mono-di twins, desired permanent sterilization Postoperative diagnosis:  prior cesarean, mono-di twins, desired permanent sterilization, Live born female x2  PROCEDURE:  Procedure(s): [place] (Bilateral)  Surgeon:  Surgeon(s) and Role:    * Darcella Shiffman, Honor Loh, MD - Primary Drinda Butts, CNM - assist  Anesthesia:  spinal I/O: Total I/O In: -  Out: 36 [Urine:200; Blood:593] Specimens:  Cord Blood, placenta portion of right tube, portion of left tube Complications: None Apparent Disposition:  VS stable to PACU  Findings: normal large uterus, tubes and ovaries bilaterally   Taylor Oneal, Taylor Oneal [id number]  Live born female  "Taylor Oneal" Birth Weight: 5 lb 10 oz (2550 g) APGAR: 31, 9  Newborn Delivery   Birth date/time: 10/23/2019 13:08:00 Delivery type: C-Section, Low Transverse Trial of labor: No C-section categorization: Repeat       Taylor Oneal, Taylor Oneal [id number]  Live born female "Taylor Oneal" Birth Weight: 6 lb 0.3 oz (2730 g) APGAR: 91, 9  Newborn Delivery   Birth date/time: 10/23/2019 13:11:00 Delivery type: C-Section, Low Transverse Trial of labor: No C-section categorization: Repeat       Indication for procedure: 39 y.o. female at [redacted]w[redacted]d with mono-di twins who was initially scheduled for surgery 2 weeks ago but developed COVID.  She has been symptom-free and quarantined during that time period, and returns today outside of the presumed infectious window.  She also has HIV and is on anti-retrovirals, with an undetectable viral load.    Procedure Details   The risks, benefits, complications, treatment options, and expected outcomes were discussed with the patient. Informed consent was obtained. The patient was taken to Operating Room,  identified as Taylor Oneal and the procedure verified as a cesarean delivery with bilateral tubal ligation   After administration of anesthesia, the patient was prepped and draped in the usual sterile manner, including a vaginal prep. A surgical time out was performed, with the pediatric team present. After confirming adequate anesthesia, a Pfannenstiel incision was made and carried down through the subcutaneous tissue to the fascia. Fascial incision was made and extended transversely. The fascia was separated from the underlying rectus tissue superiorly and inferiorly. The rectus muscles were divided in the midline. The peritoneum was identified and entered. Peritoneal incision was extended longitudinally.  A bladder flap was made.  A low transverse uterine incision was made. An amniotomy was made and delivered from cephalic presentation was a live born female from maternal right. Delayed cord clamping was performed for 60 seconds. The umbilical cord was doubly clamped and cut, and the baby was handed off to the awaitng pediatrician. A second amniotomy was made, and delivered from cephalic presentation was a live born female from maternal left. Delayed cord clamping was performed for 60 seconds, during which time we sang happy birthday to baby Taylor Oneal. The umbilical cord was doubly clamped and cut, and the baby was handed off to the awaitng pediatrician Cord blood was obtained for evaluation x2. The placenta was removed intact and appeared normal, and sent to pathology. The uterus was markedly enlarged, but firm, and was a struggle to be delivered from the abdominal cavity.  Once it was, it was cleared of clots, membranes, and debris. The uterus, tubes and ovaries appeared normal. The uterine incision was closed with running locking sutures of 0 Vicryl,  and then a second, imbricating stitch was placed. Hemostasis was observed.   The attention was turned to the bilateral fallopian tubes.  The tubes were  traced to their fimbriated ends, divided by placing a kelly clamp in the mesosalpinx, a Haney stitch placed and tied down in a step-wise fashion.   These sites were hemostatic.  The abdominal cavity was evacuated of extraneous fluid. The uterus was returned to the abdominal cavity and again the incision was inspected for hemostasis, which was confirmed.  The paracolic gutters were cleaned. The fascia was then reapproximated with running suture of vicryl. 90cc of Long- and short-acting bupivicaine was injected circumferentially into the fascia.  After a change of gloves, the subcutaneous tissue was irrigated and reapproximated with 3-0 vicryl. The skin was closed with 4-0 Monocryl and 10cc of long- and short-acting bupivacaine injected into the skin and subcutaneous tissues.  The incision was covered with surgical glue.  An abdominal binder was placed.    Instrument, sponge, and needle counts were correct prior the abdominal closure and at the conclusion of the case.   I was present and performed this procedure in its entirety.  VTE: SCDs Perioperative antibiotics: Ancef 2g HIV prophylaxis:  Although viral load was undetectable, AZT was given as a bolus and infusion 3hrs prior to delivery.  ----- Larey Days, MD Attending Obstetrician and Gynecologist [facility], Department of [facility]

## 2019-10-24 LAB — RPR: RPR Ser Ql: NONREACTIVE

## 2019-10-24 MED ORDER — EMTRICITABINE-TENOFOVIR AF 200-25 MG PO TABS
1.0000 | ORAL_TABLET | Freq: Every day | ORAL | Status: DC
  Administered 2019-10-24 – 2019-10-25 (×2): 1 via ORAL
  Filled 2019-10-24 (×3): qty 1

## 2019-10-24 MED ORDER — RITONAVIR 100 MG PO TABS
100.0000 mg | ORAL_TABLET | Freq: Every day | ORAL | Status: DC
  Administered 2019-10-24 – 2019-10-25 (×2): 100 mg via ORAL
  Filled 2019-10-24 (×2): qty 1

## 2019-10-24 MED ORDER — DARUNAVIR ETHANOLATE 600 MG PO TABS
600.0000 mg | ORAL_TABLET | Freq: Two times a day (BID) | ORAL | Status: DC
  Administered 2019-10-24 – 2019-10-25 (×4): 600 mg via ORAL
  Filled 2019-10-24 (×5): qty 1

## 2019-10-24 MED ORDER — IBUPROFEN 600 MG PO TABS: 600.0000 mg | ORAL_TABLET | Freq: Four times a day (QID) | ORAL | Status: DC

## 2019-10-24 MED ORDER — ACETAMINOPHEN 500 MG PO TABS
1000.0000 mg | ORAL_TABLET | Freq: Four times a day (QID) | ORAL | Status: DC | PRN
  Administered 2019-10-24 – 2019-10-25 (×2): 1000 mg via ORAL
  Filled 2019-10-24 (×2): qty 2

## 2019-10-24 MED ORDER — IBUPROFEN 600 MG PO TABS
600.0000 mg | ORAL_TABLET | Freq: Four times a day (QID) | ORAL | Status: DC
  Administered 2019-10-24 – 2019-10-25 (×4): 600 mg via ORAL
  Filled 2019-10-24 (×4): qty 1

## 2019-10-24 NOTE — Progress Notes (Signed)
Post OP Day 1  Subjective: no complaints, up ad lib, voiding and tolerating PO.  She had multiple episodes of vomiting last night but reports doing better this morning.  Infants are at bedside, doing well.    Doing well, no concerns. Ambulating without difficulty, pain managed with PO meds, tolerating regular diet, and voiding without difficulty.   No fever/chills, chest pain, shortness of breath, nausea/vomiting, or leg pain. No nipple or breast pain. No headache, visual changes, or RUQ/epigastric pain.  Objective: BP 110/66   Pulse 93   Temp 98.1 F (36.7 C) (Oral)   Resp 18   Ht 5\' 4"  (1.626 m)   Wt 97.5 kg   LMP 01/30/2019 (Exact Date)   SpO2 97%   Breastfeeding Unknown   BMI 36.90 kg/m    Physical Exam:  General: alert, cooperative and no distress Breasts: soft/nontender CV: RRR Pulm: nl effort, CTABL Abdomen: soft, non-tender, active bowel sounds Uterine Fundus: firm Incision: healing well, no significant drainage Perineum: intact/no edema Lochia: appropriate DVT Evaluation: No evidence of DVT seen on physical exam.  Recent Labs    10/23/19 0717  HGB 10.1*  HCT 30.5*  WBC 3.8*  PLT 161    Assessment/Plan: 39 y.o. [id number] postpartum day # 1  -Continue routine postpartum care -Encouraged snug fitting bra, cold application, Tylenol PRN, and cabbage leaves for engorgement for formula feeding  -Immunization status: Aall immunizations up to date  Disposition: Continue inpatient postpartum care, would like to discharge home tomorrow.    LOS: 1 day   Minda Meo, [place] 10/24/2019, 8:14 AM   ----- Drinda Butts  Certified Nurse Midwife [facility]

## 2019-10-24 NOTE — Progress Notes (Signed)
Rosalyn Charters CNM called for Pt request fir Tylenol and/or Ibuprofen for Pain. Rosalyn Charters CNM stated she will place orders.

## 2019-10-24 NOTE — Anesthesia Postprocedure Evaluation (Signed)
Anesthesia Post Note  Patient: Taylor Oneal  Procedure(s) Performed: CESAREAN SECTION WITH BILATERAL TUBAL LIGATION (Bilateral )  Patient location during evaluation: Mother Baby Anesthesia Type: Spinal Level of consciousness: awake Pain management: pain level controlled Respiratory status: spontaneous breathing and nonlabored ventilation Cardiovascular status: stable Postop Assessment: no headache and able to ambulate     Last Vitals:  Vitals:   10/24/19 0500 10/24/19 0902  BP:  115/68  Pulse: 93 91  Resp:  20  Temp:  36.7 C  SpO2: 97% 99%    Last Pain:  Vitals:   10/24/19 0902  TempSrc: Oral  PainSc:                  [name]

## 2019-10-25 LAB — CBC
HCT: 26.4 % — ABNORMAL LOW (ref 36.0–46.0)
Hemoglobin: 8.6 g/dL — ABNORMAL LOW (ref 12.0–15.0)
MCH: 30 pg (ref 26.0–34.0)
MCHC: 32.6 g/dL (ref 30.0–36.0)
MCV: 92 fL (ref 80.0–100.0)
Platelets: 167 10*3/uL (ref 150–400)
RBC: 2.87 MIL/uL — ABNORMAL LOW (ref 3.87–5.11)
RDW: 12.6 % (ref 11.5–15.5)
WBC: 6.6 10*3/uL (ref 4.0–10.5)
nRBC: 0 % (ref 0.0–0.2)

## 2019-10-25 MED ORDER — SIMETHICONE 80 MG PO CHEW: 160.0000 mg | CHEWABLE_TABLET | Freq: Four times a day (QID) | ORAL | 0 refills | Status: AC | PRN

## 2019-10-25 MED ORDER — SIMETHICONE 80 MG PO CHEW
160.0000 mg | CHEWABLE_TABLET | Freq: Four times a day (QID) | ORAL | Status: DC | PRN
  Administered 2019-10-25: 160 mg via ORAL
  Filled 2019-10-25: qty 2

## 2019-10-25 MED ORDER — OXYCODONE HCL 5 MG PO TABS: 5.0000 mg | ORAL_TABLET | ORAL | 0 refills | Status: AC | PRN

## 2019-10-25 MED ORDER — IBUPROFEN 600 MG PO TABS: 600.0000 mg | ORAL_TABLET | Freq: Four times a day (QID) | ORAL | 2 refills | Status: AC

## 2019-10-25 MED ORDER — PNEUMOCOCCAL VAC POLYVALENT 25 MCG/0.5ML IJ INJ
0.5000 mL | INJECTION | INTRAMUSCULAR | Status: AC | PRN
  Administered 2019-10-25: 0.5 mL via INTRAMUSCULAR
  Filled 2019-10-25: qty 0.5

## 2019-10-25 MED ORDER — ACETAMINOPHEN 500 MG PO TABS: 1000.0000 mg | ORAL_TABLET | Freq: Four times a day (QID) | ORAL | 0 refills | Status: AC | PRN

## 2019-10-25 NOTE — Progress Notes (Signed)
Afeb. VSS. Color good, skin w&d. BBS clear. Fundus is firm with small amount Lochia. Medicated with scheduled Ibuprofen and PRN Tylenol and Patient has had stated relief. Incision is well approximated without s/s complications.

## 2019-10-25 NOTE — Progress Notes (Signed)
Pt discharged with infants. Discharge instructions, prescriptions, and follow up appointments given to and reviewed with patient. Pt verbalized understanding. Escorted out by staff.

## 2019-10-25 NOTE — Discharge Summary (Signed)
Obstetrical Discharge Summary  Patient Name: Taylor Oneal DOB: 05/23/1981 MRN: [id number]  Date of Admission: 10/23/2019 Date of Delivery: 10/23/2019 Delivered by: Dr. Leonides Schanz  Date of Discharge: 10/25/2019  Primary OB: [place]  [id number] last menstrual period was 01/30/2019 (exact date). EDC Estimated Date of Delivery: 11/06/19 Gestational Age at Delivery: [redacted]w[redacted]d   Antepartum complications:  1. Monochorionic/diamniotic twin gestation 2. HIV + - well controlled 3. Covid-19 + in pregnancy  4. Short interval pregnancy  5. Depression  6. Previous c/section  7. Anemia 8. Uterine Fibroid 9. Asthma 10. Obesity 11. GBS positive  Admitting Diagnosis: Scheduled repeat c/section with BTL Secondary Diagnosis: Patient Active Problem List   Diagnosis Date Noted  . Pregnancy, supervision, high-risk, third trimester 10/23/2019  . Advanced maternal age in multigravida, first trimester 03/30/2019  . Monochorionic diamniotic twin gestation 03/30/2019  . Hyperemesis affecting pregnancy, antepartum 03/30/2019  . Axillary hidradenitis suppurativa 03/30/2019  . Eczema of right upper extremity versus tinea  03/30/2019  . Thyroid disorder 03/30/2019  . Short interval between pregnancies affecting pregnancy in first trimester, antepartum 03/30/2019  . pt desires BTL with cesarean  03/30/2019  . Depression 03/30/2019  . Obesity affecting pregnancy 03/30/2019  . Previous cesarean delivery, antepartum condition or complication 0000000  . HIV disease affecting pregnancy 06/23/2016  . Anemia affecting pregnancy 06/23/2016  . Fibroid uterus 06/23/2016  . Asthma affecting pregnancy, antepartum 06/23/2016    Augmentation: None  Complications: None Intrapartum complications/course: See OP note  Delivery Type: cesarean indication: repeat with BTL, low transverse incision  Anesthesia: epidural Placenta: spontaneous Laceration: None Episiotomy: none Newborn Data:   Taylor Oneal, Taylor Oneal [id number]  Live born female  Birth Weight: 5 lb 10 oz (2550 g) APGAR: 9, 9  Newborn Delivery   Birth date/time: 10/23/2019 13:08:00 Delivery type: C-Section, Low Transverse Trial of labor: No C-section categorization: Repeat       Taylor Oneal, Taylor Oneal [id number]  Live born female  Birth Weight: 6 lb 0.3 oz (2730 g) APGAR: 9, 9  Newborn Delivery   Birth date/time: 10/23/2019 13:11:00 Delivery type: C-Section, Low Transverse Trial of labor: No C-section categorization: Repeat     Postpartum Procedures: None  (Cesarean Section):  Patient had an uncomplicated postpartum course.  By time of discharge on POD#2, her pain was controlled on oral pain medications; she had appropriate lochia and was ambulating, voiding without difficulty, tolerating regular diet and passing flatus.   She was deemed stable for discharge to home.    Discharge Physical Exam:  BP 113/76 (BP Location: Right Arm)   Pulse 84   Temp 98.6 F (37 C) (Oral)   Resp 18   Ht 5\' 4"  (1.626 m)   Wt 97.5 kg   LMP 01/30/2019 (Exact Date)   SpO2 97%   Breastfeeding Unknown   BMI 36.90 kg/m   General: NAD CV: RRR Pulm: CTABL, nl effort ABD: s/nd/nt, fundus firm and below the umbilicus Lochia: moderate Incision: c/d/I DVT Evaluation: LE non-ttp, no evidence of DVT on exam.  Hemoglobin  Date Value Ref Range Status  10/25/2019 8.6 (L) 12.0 - 15.0 g/dL Final  10/07/2019 10.0 (L) 11.1 - 15.9 g/dL Final   HCT  Date Value Ref Range Status  10/25/2019 26.4 (L) 36.0 - 46.0 % Final   Hematocrit  Date Value Ref Range Status  10/07/2019 29.4 (L) 34.0 - 46.6 % Final    Disposition: stable, discharge to home. Baby Feeding: formula Baby Disposition: home with mom  Rh Immune globulin  given: Rh pos Rubella vaccine given: Immune Flu vaccine given in AP  setting:  08/18/19 Pneumococcal vaccine given: Prior to discharge   Contraception: BTL  Prenatal Labs:  Blood type/Rh O pos  Antibody  screen neg  Rubella Immune  Varicella Immune  RPR NR  HBsAg Neg  HIV Positive  GC neg  Chlamydia neg  Genetic screening negative  1 hour GTT WNL  3 hour GTT   GBS Positive      Plan:  Taylor Oneal was discharged to home in good condition. Follow-up appointment with delivering provider in 6 weeks.  Discharge Medications: Allergies as of 10/25/2019   No Known Allergies     Medication List    STOP taking these medications   sertraline 25 MG tablet Commonly known as: ZOLOFT     TAKE these medications   acetaminophen 500 MG tablet Commonly known as: TYLENOL Take 2 tablets (1,000 mg total) by mouth every 6 (six) hours as needed for mild pain.   clotrimazole 1 % cream Commonly known as: LOTRIMIN Apply 1 application topically 2 (two) times daily. 15GM   darunavir 600 MG tablet Commonly known as: PREZISTA Take 600 mg by mouth 2 (two) times a day.   emtricitabine-tenofovir 200-300 MG tablet Commonly known as: Truvada Take 1 tablet by mouth daily.   ibuprofen 600 MG tablet Commonly known as: ADVIL Take 1 tablet (600 mg total) by mouth every 6 (six) hours.   oxyCODONE 5 MG immediate release tablet Commonly known as: Oxy IR/ROXICODONE Take 1 tablet (5 mg total) by mouth every 4 (four) hours as needed for moderate pain.   prenatal multivitamin Tabs tablet Take 1 tablet by mouth daily.   ritonavir 100 MG Tabs tablet Commonly known as: NORVIR Take 100 mg by mouth daily with breakfast.   simethicone 80 MG chewable tablet Commonly known as: MYLICON Chew 2 tablets (160 mg total) by mouth 4 (four) times daily as needed for flatulence.       Follow-up Information    Ward, Honor Loh, MD Follow up in 2 week(s).   Specialty: Obstetrics and Gynecology Why: for incision check  Contact information: [place] [address] [place], [place] Certified Nurse Midwife [place] [facility]

## 2019-10-27 LAB — SURGICAL PATHOLOGY

## 2020-05-23 LAB — SYPHILIS: RPR W/REFLEX TO RPR TITER AND TREPONEMAL ANTIBODIES, TRADITIONAL SCREENING AND DIAGNOSIS ALGORITHM: RPR Ser Ql: NONREACTIVE — AB
